# Patient Record
Sex: Female | Born: 1940 | Race: Black or African American | Hispanic: No | Marital: Married | Smoking: Never smoker
Health system: Southern US, Community
[De-identification: ages and names within clinical notes are randomized; demographics above are authoritative.]

## PROBLEM LIST (undated history)

## (undated) DIAGNOSIS — E785 Hyperlipidemia, unspecified: Secondary | ICD-10-CM

## (undated) DIAGNOSIS — Z87448 Personal history of other diseases of urinary system: Secondary | ICD-10-CM

## (undated) DIAGNOSIS — I10 Essential (primary) hypertension: Secondary | ICD-10-CM

## (undated) DIAGNOSIS — I498 Other specified cardiac arrhythmias: Secondary | ICD-10-CM

## (undated) DIAGNOSIS — L408 Other psoriasis: Secondary | ICD-10-CM

## (undated) DIAGNOSIS — Z780 Asymptomatic menopausal state: Secondary | ICD-10-CM

## (undated) DIAGNOSIS — F329 Major depressive disorder, single episode, unspecified: Secondary | ICD-10-CM

## (undated) HISTORY — DX: Other specified cardiac arrhythmias: I49.8

## (undated) HISTORY — DX: Essential (primary) hypertension: I10

## (undated) HISTORY — DX: Hyperlipidemia, unspecified: E78.5

## (undated) HISTORY — PX: EYE SURGERY: SHX253

## (undated) HISTORY — DX: Personal history of other diseases of urinary system: Z87.448

## (undated) HISTORY — DX: Other psoriasis: L40.8

## (undated) HISTORY — DX: Major depressive disorder, single episode, unspecified: F32.9

## (undated) HISTORY — DX: Asymptomatic menopausal state: Z78.0

---

## 1997-08-23 ENCOUNTER — Other Ambulatory Visit: Admission: RE | Admit: 1997-08-23 | Discharge: 1997-08-23 | Payer: Self-pay | Admitting: *Deleted

## 1998-10-28 ENCOUNTER — Other Ambulatory Visit: Admission: RE | Admit: 1998-10-28 | Discharge: 1998-10-28 | Payer: Self-pay | Admitting: *Deleted

## 1999-11-10 ENCOUNTER — Encounter: Admission: RE | Admit: 1999-11-10 | Discharge: 1999-11-10 | Payer: Self-pay | Admitting: *Deleted

## 1999-11-10 ENCOUNTER — Encounter: Payer: Self-pay | Admitting: *Deleted

## 1999-12-05 ENCOUNTER — Encounter: Payer: Self-pay | Admitting: Family Medicine

## 1999-12-05 ENCOUNTER — Encounter: Admission: RE | Admit: 1999-12-05 | Discharge: 1999-12-05 | Payer: Self-pay | Admitting: Family Medicine

## 1999-12-09 ENCOUNTER — Other Ambulatory Visit: Admission: RE | Admit: 1999-12-09 | Discharge: 1999-12-09 | Payer: Self-pay | Admitting: *Deleted

## 2000-11-10 ENCOUNTER — Encounter: Admission: RE | Admit: 2000-11-10 | Discharge: 2000-11-10 | Payer: Self-pay | Admitting: *Deleted

## 2000-11-10 ENCOUNTER — Encounter: Payer: Self-pay | Admitting: *Deleted

## 2001-03-03 ENCOUNTER — Inpatient Hospital Stay (HOSPITAL_COMMUNITY): Admission: EM | Admit: 2001-03-03 | Discharge: 2001-03-06 | Payer: Self-pay | Admitting: *Deleted

## 2001-11-11 ENCOUNTER — Encounter: Admission: RE | Admit: 2001-11-11 | Discharge: 2001-11-11 | Payer: Self-pay | Admitting: *Deleted

## 2001-11-11 ENCOUNTER — Encounter: Payer: Self-pay | Admitting: *Deleted

## 2002-04-26 ENCOUNTER — Ambulatory Visit: Admission: RE | Admit: 2002-04-26 | Discharge: 2002-04-26 | Payer: Self-pay | Admitting: Gastroenterology

## 2002-11-15 ENCOUNTER — Encounter: Payer: Self-pay | Admitting: Family Medicine

## 2002-11-15 ENCOUNTER — Encounter: Admission: RE | Admit: 2002-11-15 | Discharge: 2002-11-15 | Payer: Self-pay | Admitting: Family Medicine

## 2002-12-26 ENCOUNTER — Ambulatory Visit (HOSPITAL_COMMUNITY): Admission: RE | Admit: 2002-12-26 | Discharge: 2002-12-26 | Payer: Self-pay | Admitting: Dermatology

## 2003-01-30 ENCOUNTER — Other Ambulatory Visit (HOSPITAL_COMMUNITY): Admission: RE | Admit: 2003-01-30 | Discharge: 2003-02-06 | Payer: Self-pay | Admitting: Psychiatry

## 2003-11-16 ENCOUNTER — Encounter: Admission: RE | Admit: 2003-11-16 | Discharge: 2003-11-16 | Payer: Self-pay | Admitting: Endocrinology

## 2004-02-21 ENCOUNTER — Ambulatory Visit: Payer: Self-pay | Admitting: Endocrinology

## 2004-07-29 ENCOUNTER — Ambulatory Visit: Payer: Self-pay | Admitting: Endocrinology

## 2004-08-05 ENCOUNTER — Ambulatory Visit: Payer: Self-pay | Admitting: Endocrinology

## 2004-12-10 ENCOUNTER — Encounter: Admission: RE | Admit: 2004-12-10 | Discharge: 2004-12-10 | Payer: Self-pay | Admitting: Endocrinology

## 2005-02-11 ENCOUNTER — Ambulatory Visit: Payer: Self-pay | Admitting: Endocrinology

## 2005-08-03 ENCOUNTER — Ambulatory Visit: Payer: Self-pay | Admitting: Internal Medicine

## 2005-08-06 ENCOUNTER — Ambulatory Visit: Payer: Self-pay | Admitting: Endocrinology

## 2005-08-06 ENCOUNTER — Other Ambulatory Visit: Admission: RE | Admit: 2005-08-06 | Discharge: 2005-08-06 | Payer: Self-pay | Admitting: Endocrinology

## 2005-08-06 ENCOUNTER — Encounter: Payer: Self-pay | Admitting: Endocrinology

## 2005-09-15 ENCOUNTER — Ambulatory Visit: Payer: Self-pay | Admitting: Family Medicine

## 2005-12-11 ENCOUNTER — Encounter: Admission: RE | Admit: 2005-12-11 | Discharge: 2005-12-11 | Payer: Self-pay | Admitting: Endocrinology

## 2006-08-12 ENCOUNTER — Ambulatory Visit: Payer: Self-pay | Admitting: Endocrinology

## 2006-08-12 LAB — CONVERTED CEMR LAB
ALT: 28 units/L (ref 0–40)
AST: 31 units/L (ref 0–37)
Albumin: 4.2 g/dL (ref 3.5–5.2)
Alkaline Phosphatase: 44 units/L (ref 39–117)
Bilirubin, Direct: 0.1 mg/dL (ref 0.0–0.3)
CO2: 32 meq/L (ref 19–32)
Chloride: 104 meq/L (ref 96–112)
Cholesterol: 166 mg/dL (ref 0–200)
Eosinophils Relative: 0.6 % (ref 0.0–5.0)
GFR calc Af Amer: 93 mL/min
Glucose, Bld: 94 mg/dL (ref 70–99)
HCT: 38.7 % (ref 36.0–46.0)
HDL: 79.9 mg/dL (ref >39.0)
Hemoglobin, Urine: NEGATIVE
Hemoglobin: 13.1 g/dL (ref 12.0–15.0)
Ketones, ur: NEGATIVE mg/dL
Monocytes Absolute: 0.4 10*3/uL (ref 0.2–0.7)
Platelets: 204 10*3/uL (ref 150–400)
RBC: 4.41 M/uL (ref 3.87–5.11)
TSH: 0.87 microintl units/mL (ref 0.35–5.50)
Total Bilirubin: 0.7 mg/dL (ref 0.3–1.2)
Total CHOL/HDL Ratio: 2.1
Total Protein, Urine: NEGATIVE mg/dL
Total Protein: 7 g/dL (ref 6.0–8.3)
Urobilinogen, UA: 0.2 (ref 0.0–1.0)
pH: 7 (ref 5.0–8.0)

## 2006-08-17 ENCOUNTER — Ambulatory Visit: Payer: Self-pay | Admitting: Endocrinology

## 2006-11-30 ENCOUNTER — Encounter: Payer: Self-pay | Admitting: Endocrinology

## 2006-11-30 DIAGNOSIS — L408 Other psoriasis: Secondary | ICD-10-CM

## 2006-11-30 DIAGNOSIS — I10 Essential (primary) hypertension: Secondary | ICD-10-CM

## 2006-11-30 DIAGNOSIS — E785 Hyperlipidemia, unspecified: Secondary | ICD-10-CM

## 2006-11-30 DIAGNOSIS — Z87448 Personal history of other diseases of urinary system: Secondary | ICD-10-CM | POA: Insufficient documentation

## 2006-11-30 DIAGNOSIS — R519 Headache, unspecified: Secondary | ICD-10-CM | POA: Insufficient documentation

## 2006-11-30 DIAGNOSIS — R51 Headache: Secondary | ICD-10-CM

## 2006-11-30 DIAGNOSIS — M810 Age-related osteoporosis without current pathological fracture: Secondary | ICD-10-CM | POA: Insufficient documentation

## 2006-11-30 DIAGNOSIS — F339 Major depressive disorder, recurrent, unspecified: Secondary | ICD-10-CM

## 2006-11-30 DIAGNOSIS — F329 Major depressive disorder, single episode, unspecified: Secondary | ICD-10-CM

## 2006-11-30 DIAGNOSIS — F3289 Other specified depressive episodes: Secondary | ICD-10-CM

## 2006-11-30 HISTORY — DX: Other specified depressive episodes: F32.89

## 2006-11-30 HISTORY — DX: Personal history of other diseases of urinary system: Z87.448

## 2006-11-30 HISTORY — DX: Other psoriasis: L40.8

## 2006-11-30 HISTORY — DX: Hyperlipidemia, unspecified: E78.5

## 2006-11-30 HISTORY — DX: Major depressive disorder, single episode, unspecified: F32.9

## 2006-11-30 HISTORY — DX: Essential (primary) hypertension: I10

## 2006-12-14 ENCOUNTER — Encounter: Admission: RE | Admit: 2006-12-14 | Discharge: 2006-12-14 | Payer: Self-pay | Admitting: Endocrinology

## 2007-08-18 ENCOUNTER — Ambulatory Visit: Payer: Self-pay | Admitting: Endocrinology

## 2007-08-19 LAB — CONVERTED CEMR LAB
AST: 30 units/L (ref 0–37)
Albumin: 4.3 g/dL (ref 3.5–5.2)
Alkaline Phosphatase: 46 units/L (ref 39–117)
BUN: 9 mg/dL (ref 6–23)
CO2: 29 meq/L (ref 19–32)
Chloride: 102 meq/L (ref 96–112)
Cholesterol: 146 mg/dL (ref 0–200)
Creatinine, Ser: 0.8 mg/dL (ref 0.4–1.2)
Crystals: NEGATIVE
Eosinophils Relative: 1.3 % (ref 0.0–5.0)
GFR calc Af Amer: 93 mL/min
GFR calc non Af Amer: 77 mL/min
Glucose, Bld: 97 mg/dL (ref 70–99)
HDL: 76.3 mg/dL (ref >39.0)
MCHC: 33.6 g/dL (ref 30.0–36.0)
Mucus, UA: NEGATIVE
Neutro Abs: 4.6 10*3/uL (ref 1.4–7.7)
Nitrite: NEGATIVE
Platelets: 195 10*3/uL (ref 150–400)
RDW: 14.1 % (ref 11.5–14.6)
Sodium: 139 meq/L (ref 135–145)
Specific Gravity, Urine: <=1.005 (ref 1.000–1.03)
Triglycerides: 39 mg/dL (ref 0–149)
WBC: 6.8 10*3/uL (ref 4.5–10.5)

## 2007-08-23 ENCOUNTER — Ambulatory Visit: Payer: Self-pay | Admitting: Endocrinology

## 2007-08-23 DIAGNOSIS — Z780 Asymptomatic menopausal state: Secondary | ICD-10-CM

## 2007-08-23 DIAGNOSIS — I498 Other specified cardiac arrhythmias: Secondary | ICD-10-CM

## 2007-08-23 HISTORY — DX: Asymptomatic menopausal state: Z78.0

## 2007-08-23 HISTORY — DX: Other specified cardiac arrhythmias: I49.8

## 2007-08-31 ENCOUNTER — Ambulatory Visit: Payer: Self-pay | Admitting: Internal Medicine

## 2007-08-31 ENCOUNTER — Encounter: Payer: Self-pay | Admitting: Endocrinology

## 2007-10-05 ENCOUNTER — Encounter: Payer: Self-pay | Admitting: Endocrinology

## 2007-10-11 ENCOUNTER — Telehealth (INDEPENDENT_AMBULATORY_CARE_PROVIDER_SITE_OTHER): Payer: Self-pay | Admitting: *Deleted

## 2007-10-12 ENCOUNTER — Telehealth (INDEPENDENT_AMBULATORY_CARE_PROVIDER_SITE_OTHER): Payer: Self-pay | Admitting: *Deleted

## 2007-12-19 ENCOUNTER — Encounter: Admission: RE | Admit: 2007-12-19 | Discharge: 2007-12-19 | Payer: Self-pay | Admitting: Endocrinology

## 2007-12-27 ENCOUNTER — Encounter: Payer: Self-pay | Admitting: Endocrinology

## 2007-12-28 ENCOUNTER — Encounter: Payer: Self-pay | Admitting: Endocrinology

## 2008-02-21 ENCOUNTER — Ambulatory Visit: Payer: Self-pay | Admitting: Endocrinology

## 2008-02-27 ENCOUNTER — Telehealth (INDEPENDENT_AMBULATORY_CARE_PROVIDER_SITE_OTHER): Payer: Self-pay | Admitting: *Deleted

## 2008-02-27 ENCOUNTER — Ambulatory Visit: Payer: Self-pay | Admitting: Endocrinology

## 2008-03-06 ENCOUNTER — Telehealth: Payer: Self-pay | Admitting: Internal Medicine

## 2008-04-17 ENCOUNTER — Telehealth (INDEPENDENT_AMBULATORY_CARE_PROVIDER_SITE_OTHER): Payer: Self-pay | Admitting: *Deleted

## 2008-08-01 ENCOUNTER — Telehealth: Payer: Self-pay | Admitting: Endocrinology

## 2008-08-06 ENCOUNTER — Telehealth: Payer: Self-pay | Admitting: Endocrinology

## 2008-09-04 ENCOUNTER — Ambulatory Visit: Payer: Self-pay | Admitting: Internal Medicine

## 2008-09-04 DIAGNOSIS — N390 Urinary tract infection, site not specified: Secondary | ICD-10-CM | POA: Insufficient documentation

## 2008-09-04 LAB — CONVERTED CEMR LAB
Glucose, Urine, Semiquant: NEGATIVE
Ketones, urine, test strip: NEGATIVE
Specific Gravity, Urine: <1.005
pH: 7.5

## 2008-09-05 ENCOUNTER — Encounter: Payer: Self-pay | Admitting: Endocrinology

## 2008-09-10 ENCOUNTER — Encounter (INDEPENDENT_AMBULATORY_CARE_PROVIDER_SITE_OTHER): Payer: Self-pay | Admitting: *Deleted

## 2008-09-18 ENCOUNTER — Ambulatory Visit: Payer: Self-pay | Admitting: Endocrinology

## 2008-11-09 ENCOUNTER — Telehealth: Payer: Self-pay | Admitting: Endocrinology

## 2008-12-20 ENCOUNTER — Encounter: Admission: RE | Admit: 2008-12-20 | Discharge: 2008-12-20 | Payer: Self-pay | Admitting: Endocrinology

## 2009-01-12 ENCOUNTER — Ambulatory Visit: Payer: Self-pay | Admitting: Family Medicine

## 2009-01-12 LAB — CONVERTED CEMR LAB
Specific Gravity, Urine: <1.005
pH: 8.5

## 2009-01-14 ENCOUNTER — Telehealth: Payer: Self-pay | Admitting: Endocrinology

## 2009-08-19 ENCOUNTER — Telehealth: Payer: Self-pay | Admitting: Endocrinology

## 2009-09-09 ENCOUNTER — Telehealth: Payer: Self-pay | Admitting: Endocrinology

## 2009-10-02 ENCOUNTER — Ambulatory Visit: Payer: Self-pay | Admitting: Endocrinology

## 2009-10-02 ENCOUNTER — Encounter: Payer: Self-pay | Admitting: Internal Medicine

## 2009-10-02 DIAGNOSIS — R609 Edema, unspecified: Secondary | ICD-10-CM | POA: Insufficient documentation

## 2009-10-03 ENCOUNTER — Telehealth: Payer: Self-pay | Admitting: Endocrinology

## 2009-10-08 ENCOUNTER — Encounter: Payer: Self-pay | Admitting: Endocrinology

## 2009-10-08 ENCOUNTER — Ambulatory Visit: Payer: Self-pay | Admitting: Internal Medicine

## 2009-11-14 ENCOUNTER — Ambulatory Visit: Payer: Self-pay | Admitting: Internal Medicine

## 2009-11-14 LAB — CONVERTED CEMR LAB
Glucose, Urine, Semiquant: NEGATIVE
Nitrite: POSITIVE
Protein, U semiquant: >=300
Specific Gravity, Urine: 1.01
pH: 6.5

## 2009-12-05 ENCOUNTER — Telehealth: Payer: Self-pay | Admitting: Endocrinology

## 2009-12-25 ENCOUNTER — Encounter: Admission: RE | Admit: 2009-12-25 | Discharge: 2009-12-25 | Payer: Self-pay | Admitting: Endocrinology

## 2010-04-13 LAB — CONVERTED CEMR LAB
ALT: 31 units/L (ref 0–35)
AST: 32 units/L (ref 0–37)
Alkaline Phosphatase: 42 units/L (ref 39–117)
BUN: 11 mg/dL (ref 6–23)
Basophils Absolute: 0 10*3/uL (ref 0.0–0.1)
Basophils Relative: 0.3 % (ref 0.0–3.0)
Basophils Relative: 0.6 % (ref 0.0–3.0)
Bilirubin Urine: NEGATIVE
Bilirubin, Direct: 0.1 mg/dL (ref 0.0–0.3)
Calcium, Total (PTH): 9.9 mg/dL (ref 8.4–10.5)
Calcium: 9.6 mg/dL (ref 8.4–10.5)
Chloride: 108 meq/L (ref 96–112)
Cholesterol: 128 mg/dL (ref 0–200)
Cholesterol: 158 mg/dL (ref 0–200)
Creatinine, Ser: 0.7 mg/dL (ref 0.4–1.2)
Creatinine, Ser: 0.8 mg/dL (ref 0.4–1.2)
Eosinophils Absolute: 0.1 10*3/uL (ref 0.0–0.7)
Eosinophils Relative: 0.7 % (ref 0.0–5.0)
Eosinophils Relative: 0.8 % (ref 0.0–5.0)
GFR calc non Af Amer: 103.91 mL/min (ref >60)
GFR calc non Af Amer: 92.3 mL/min (ref >60)
Glucose, Bld: 92 mg/dL (ref 70–99)
HCT: 36.2 % (ref 36.0–46.0)
HCT: 38.9 % (ref 36.0–46.0)
HDL: 80.8 mg/dL (ref >39.00)
Hemoglobin: 12.4 g/dL (ref 12.0–15.0)
LDL Cholesterol: 53 mg/dL (ref 0–99)
LDL Cholesterol: 64 mg/dL (ref 0–99)
Lymphocytes Relative: 23.6 % (ref 12.0–46.0)
Lymphs Abs: 1.4 10*3/uL (ref 0.7–4.0)
Lymphs Abs: 1.5 10*3/uL (ref 0.7–4.0)
MCV: 87.8 fL (ref 78.0–100.0)
Monocytes Relative: 5.4 % (ref 3.0–12.0)
Monocytes Relative: 6 % (ref 3.0–12.0)
Neutro Abs: 4.9 10*3/uL (ref 1.4–7.7)
PTH: 40 pg/mL (ref 14.0–72.0)
Platelets: 170 10*3/uL (ref 150.0–400.0)
Potassium: 3.9 meq/L (ref 3.5–5.1)
RBC: 4.43 M/uL (ref 3.87–5.11)
RDW: 12.7 % (ref 11.5–14.6)
Sodium: 142 meq/L (ref 135–145)
Sodium: 143 meq/L (ref 135–145)
TSH: 0.44 microintl units/mL (ref 0.35–5.50)
TSH: 0.95 microintl units/mL (ref 0.35–5.50)
Total Bilirubin: 0.9 mg/dL (ref 0.3–1.2)
Total CHOL/HDL Ratio: 2
Total Protein: 6.9 g/dL (ref 6.0–8.3)
WBC: 6.4 10*3/uL (ref 4.5–10.5)
WBC: 6.7 10*3/uL (ref 4.5–10.5)
pH: 7.5 (ref 5.0–8.0)

## 2010-04-15 NOTE — Progress Notes (Signed)
Summary: fosamax  Phone Note Refill Request Message from:  Fax from Pharmacy on August 19, 2009 3:34 PM  Refills Requested: Medication #1:  FOSAMAX 70 MG  TABS take 1 by mouth q week  Method Requested: Fax to [facility] Initial call taken by: Orlan Leavens,  August 19, 2009 3:34 PM    Prescriptions: FOSAMAX 70 MG  TABS (ALENDRONATE SODIUM) take 1 by mouth q week  #4 x 0   Entered by:   Orlan Leavens   Authorized by:   Minus Breeding MD   Signed by:   Orlan Leavens on 08/19/2009   Method used:   Faxed to ...       [facility]* (mail-order)             ,          Ph: [phone]       Fax: [phone]   RxID:   [id number]

## 2010-04-15 NOTE — Assessment & Plan Note (Signed)
Summary: ?bladder inf,blood in urine/sae/cd   Vital Signs:  Patient profile:   70 year old female Height:      62 inches Weight:      129 pounds O2 Sat:      99 % on Room air Temp:     98.4 degrees F oral Pulse rate:   72 / minute Pulse rhythm:   regular Resp:     16 per minute BP sitting:   108 / 80  (right arm)  Vitals Entered By: Rock Nephew CMA (November 14, 2009 4:19 PM)  O2 Flow:  Room air  Primary Care Chanse Kagel:  Minus Breeding MD  CC:  Dysuria.  History of Present Illness:  Dysuria      This is a 70 year old woman who presents with Dysuria.  The symptoms began 5 days ago.  The intensity is described as moderate.  The patient reports burning with urination, urinary frequency, urgency, and hematuria, but denies vaginal discharge, vaginal itching, and vaginal sores.  The patient denies the following associated symptoms: nausea, vomiting, fever, shaking chills, flank pain, abdominal pain, back pain, pelvic pain, and arthralgias.  The patient denies the following risk factors: diabetes, prior antibiotics, immunosuppression, history of GU anomaly, history of pyelonephritis, pregnancy, history of STD, and analgesic abuse.    Preventive Screening-Counseling & Management  Alcohol-Tobacco     Alcohol drinks/day: 0     Smoking Status: never     Tobacco Counseling: to remain off tobacco products  Hep-HIV-STD-Contraception     Hepatitis Risk: no risk noted     HIV Risk: no risk noted     STD Risk: no risk noted      Sexual History:  currently monogamous.        Drug Use:  never.        Blood Transfusions:  no.    Clinical Review Panels:  Diabetes Management   Creatinine:  0.7 (10/02/2009)   Last Flu Vaccine:  Historical (12/27/2007)  CBC   WBC:  6.7 (10/02/2009)   RBC:  4.43 (10/02/2009)   Hgb:  13.2 (10/02/2009)   Hct:  38.9 (10/02/2009)   Platelets:  170.0 (10/02/2009)   MCV  87.8 (10/02/2009)   MCHC  34.0 (10/02/2009)   RDW  14.7 (10/02/2009)   PMN:   72.3 (10/02/2009)   Lymphs:  21.2 (10/02/2009)   Monos:  5.4 (10/02/2009)   Eosinophils:  0.8 (10/02/2009)   Basophil:  0.3 (10/02/2009)  Complete Metabolic Panel   Glucose:  94 (10/02/2009)   Sodium:  142 (10/02/2009)   Potassium:  3.7 (10/02/2009)   Chloride:  108 (10/02/2009)   CO2:  31 (10/02/2009)   BUN:  13 (10/02/2009)   Creatinine:  0.7 (10/02/2009)   Albumin:  4.6 (10/02/2009)   Total Protein:  7.2 (10/02/2009)   Calcium:  9.6 (10/02/2009)   Total Bili:  0.6 (10/02/2009)   Alk Phos:  38 (10/02/2009)   SGPT (ALT):  28 (10/02/2009)   SGOT (AST):  36 (10/02/2009)   Medications Prior to Update: 1)  Elidel 1 %  Crea (Pimecrolimus) .... Apply Qd 2)  Crestor 40 Mg  Tabs (Rosuvastatin Calcium) .... Take 1 By Mouth Qhs 3)  Adult Aspirin Low Strength 81 Mg  Tbdp (Aspirin) .... Take 1 By Mouth Qd 4)  Maxzide-25 37.5-25 Mg  Tabs (Triamterene-Hctz) .... Take 1/2 Tab By Mouth Qd  Current Medications (verified): 1)  Elidel 1 %  Crea (Pimecrolimus) .... Apply Qd 2)  Crestor 40 Mg  Tabs (Rosuvastatin Calcium) .... Take 1 By Mouth Qhs 3)  Adult Aspirin Low Strength 81 Mg  Tbdp (Aspirin) .... Take 1 By Mouth Qd 4)  Maxzide-25 37.5-25 Mg  Tabs (Triamterene-Hctz) .... Take 1/2 Tab By Mouth Qd 5)  Ciprofloxacin Hcl 250 Mg Tabs (Ciprofloxacin Hcl) .... 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 sister had breast cancer  Social  History: Reviewed history from 02/27/2008 and no changes required. 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  The patient denies anorexia, fever, weight loss, weight gain, chest pain, dyspnea on exertion, peripheral edema, prolonged cough, headaches, hemoptysis, abdominal pain, suspicious skin lesions, and enlarged lymph nodes.    Physical Exam  General:  alert, well-hydrated, and normal appearance.   Neck:  supple, full ROM, and no masses.   Lungs:  normal respiratory effort, no intercostal retractions, no accessory muscle use, normal breath sounds, no dullness, no fremitus, no crackles, and no wheezes.   Heart:  normal rate, regular rhythm, no murmur, no gallop, and no rub.   Abdomen:  soft, non-tender, normal bowel sounds, no distention, no masses, no guarding, no rigidity, no rebound tenderness, no abdominal hernia, no inguinal hernia, no hepatomegaly, and no splenomegaly.  No CVAT. Msk:  normal ROM, no joint tenderness, no joint swelling, no joint warmth, no redness over joints, no joint deformities, no joint instability, and no crepitation.   Pulses:  R and L carotid,radial,femoral,dorsalis pedis and posterior tibial pulses are full and equal bilaterally Extremities:  No clubbing, cyanosis, edema, or deformity noted with normal full range of motion of all joints.   Neurologic:  No cranial nerve deficits noted. Station and gait are normal. Plantar reflexes are down-going bilaterally. DTRs are symmetrical throughout. Sensory, motor and coordinative functions appear intact. Skin:  turgor normal, color normal, no rashes, no suspicious lesions, no ecchymoses, no petechiae, no purpura, no ulcerations, and no edema.   Cervical Nodes:  no anterior cervical adenopathy and  no posterior cervical adenopathy.   Axillary Nodes:  no R axillary adenopathy and no L axillary adenopathy.   Inguinal Nodes:  no R inguinal adenopathy and no L inguinal adenopathy.   Psych:  Cognition and judgment appear intact. Alert and cooperative with normal attention span and concentration. No apparent delusions, illusions, hallucinations   Impression & Recommendations:  Problem # 1:  UTI (ICD-599.0) Assessment Deteriorated  Her updated medication list for this problem includes:    Ciprofloxacin Hcl 250 Mg Tabs (Ciprofloxacin hcl) ..... One by mouth two times a day for 7 days  Orders: T-Culture, Urine ([id number])  Complete Medication List: 1)  Elidel 1 % Crea (Pimecrolimus) .... Apply qd 2)  Crestor 40 Mg Tabs (Rosuvastatin calcium) .... Take 1 by mouth qhs 3)  Adult Aspirin Low Strength 81 Mg Tbdp (Aspirin) .Marland KitchenMarland KitchenMarland Kitchen  Take 1 by mouth qd 4)  Maxzide-25 37.5-25 Mg Tabs (Triamterene-hctz) .... Take 1/2 tab by mouth qd 5)  Ciprofloxacin Hcl 250 Mg Tabs (Ciprofloxacin hcl) .... One by mouth two times a day for 7 days  Patient Instructions: 1)  Please schedule a follow-up appointment in 1 month. 2)  Take your antibiotic as prescribed until ALL of it is gone, but stop if you develop a rash or swelling and contact our office as soon as possible. Prescriptions: CIPROFLOXACIN HCL 250 MG TABS (CIPROFLOXACIN HCL) One by mouth two times a day for 7 days  #14 x 1   Entered and Authorized by:   Etta Grandchild MD   Signed by:   Etta Grandchild MD on 11/14/2009   Method used:   [facility]        [facility] [phone]* (retail)       [address]       [place], [place]  [id number]       Ph: [phone] or [phone]       Fax: [phone]   RxID:   [id number] CIPROFLOXACIN HCL 250 MG TABS (CIPROFLOXACIN HCL) One by mouth two times a day for 7 days  #14 x 1   Entered and Authorized by:   Etta Grandchild MD   Signed by:   Etta Grandchild MD on  11/14/2009   Method used:   Electronically to        [facility] (retail)       [address]       [place], [place]  [address]       Ph: [phone]       Fax: [phone]   RxID:   [id number]   Laboratory Results   Urine Tests   Date/Time Reported: Lamar Sprinkles, CMA  November 14, 2009 4:26 PM   Routine Urinalysis   Color: red Appearance: Cloudy Glucose: negative   (Normal Range: Negative) Bilirubin: negative   (Normal Range: Negative) Ketone: negative   (Normal Range: Negative) Spec. Gravity: 1.010   (Normal Range: 1.003-1.035) Blood: large   (Normal Range: Negative) pH: 6.5   (Normal Range: 5.0-8.0) Protein: >=300   (Normal Range: Negative) Urobilinogen: negative   (Normal Range: 0-1) Nitrite: positive   (Normal Range: Negative) Leukocyte Esterace: large   (Normal Range: Negative)

## 2010-04-15 NOTE — Assessment & Plan Note (Signed)
Summary: yearly f/u medicare/#/cd   Vital Signs:  Patient profile:   70 year old female Height:      62 inches (157.48 cm) Weight:      128.50 pounds (58.41 kg) BMI:     23.59 O2 Sat:      94 % on Room air Temp:     98.1 degrees F (36.72 degrees C) oral Pulse rate:   66 / minute BP sitting:   128 / 74  (left arm) Cuff size:   regular  Vitals Entered By: Brenton Grills MA (October 02, 2009 8:25 AM)  O2 Flow:  Room air CC: Yearly F/U appt/aj   Primary Provider:  Minus Breeding MD  CC:  Yearly F/U appt/aj.  History of Present Illness: here for regular wellness examination.  she's feeling pretty well in general, and does not smoke.  alcohol is rare. she wants labs for estrogen and edema  Current Medications (verified): 1)  Elidel 1 %  Crea (Pimecrolimus) .... Apply Qd 2)  Crestor 40 Mg  Tabs (Rosuvastatin Calcium) .... Take 1 By Mouth Qhs 3)  Adult Aspirin Low Strength 81 Mg  Tbdp (Aspirin) .... Take 1 By Mouth Qd 4)  Fosamax 70 Mg  Tabs (Alendronate Sodium) .... Take 1 By Mouth Q Week 5)  Maxzide-25 37.5-25 Mg  Tabs (Triamterene-Hctz) .... Take 1/2 Tab By Mouth Qd  Allergies (verified): No Known Drug Allergies  Past History:  Past Medical History: Last updated: 11/30/2006 Depression Hyperlipidemia Hypertension Osteoporosis  Family History: Reviewed history from 08/23/2007 and no changes required. sister had breast cancer  Social History: Reviewed history from 02/27/2008 and no changes required. 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    Physical Exam  General:  normal appearance.   Head:  head: no deformity eyes: no periorbital swelling, no proptosis external  nose and ears are normal mouth: no lesion seen Neck:  Supple without thyroid enlargement or tenderness.  Breasts:  No tenderness, masses, nipple discharge, or skin abnormalities.  Lungs:  Clear to auscultation bilaterally. Normal respiratory effort.  Heart:  Regular rate and rhythm without murmurs or gallops noted. Normal S1,S2.   Abdomen:  abdomen is soft, nontender.  no hepatosplenomegaly.   not distended.  no hernia  Rectal:  normal external and internal exam.  heme neg  Genitalia:  Pelvic Exam:        External: normal female genitalia without lesions or masses        Vagina: normal without lesions or masses        Cervix: normal without lesions or masses        Adnexa: normal bimanual exam without masses or fullness        Uterus: normal by palpation Msk:  muscle bulk and strength are grossly normal.  no obvious joint swelling.  gait is normal and steady  Pulses:  dorsalis pedis intact bilat.  no carotid bruit  Extremities:  no deformity.  no ulcer on the feet.  feet are of normal color and temp.  no edema  Neurologic:  cn 2-12 grossly intact.   readily moves all 4's.   sensation is intact to touch on the feet  Skin:  normal texture and temp.  no rash.  not diaphoretic  Cervical Nodes:  No  significant adenopathy.  Psych:  Alert and cooperative; normal mood and affect; normal attention span and concentration.     Impression & Recommendations:  Problem # 1:  ROUTINE GENERAL MEDICAL EXAM@HEALTH  CARE FACL (ICD-V70.0)  Problem # 2:  OSTEOPOROSIS (ICD-733.00) she has completed course of fosamax  Other Orders: T-Parathyroid Hormone, Intact w/ Calcium (08657-84696) T-Estradiol (29528-41324) EKG w/ Interpretation (93000) T-Bone Densitometry (40102) TLB-Lipid Panel (80061-LIPID) TLB-BMP (Basic Metabolic Panel-BMET) (80048-METABOL) TLB-CBC Platelet - w/Differential (85025-CBCD) TLB-Hepatic/Liver Function Pnl (80076-HEPATIC) TLB-TSH (Thyroid Stimulating Hormone)  (84443-TSH) TLB-BNP (B-Natriuretic Peptide) (83880-BNPR) Est. Patient 65& > (72536)  Patient Instructions: 1)  stop fosamax 2)  please consider these measures for your health:  minimize alcohol.  do not use tobacco products.  have a colonoscopy at least every 10 years from age 15.  keep firearms safely stored.  always use seat belts.  have working smoke alarms in your home.  see an eye doctor and dentist regularly.  never drive under the influence of alcohol or drugs (including prescription drugs).   3)  please let me know what your wishes would be, if artificial life support measures should become necessary.  it is critically important to prevent falling down (keep floor areas well-lit, dry, and free of loose objects) 4)  blood tests are being ordered for you today.  please call [phone] to hear your test results. 5)  recheck bone density test Prescriptions: MAXZIDE-25 37.5-25 MG  TABS (TRIAMTERENE-HCTZ) take 1/2 tab by mouth qd  #45 Tablet x 3   Entered and Authorized by:   Minus Breeding MD   Signed by:   Minus Breeding MD on 10/02/2009   Method used:   [facility]        [facility]  [address] [phone][facility] (retail)       [address]       [place], [place]  [address]       Ph: [phone] or [phone]       Fax: [phone]   RxID:   [id number] CRESTOR 40 MG  TABS (ROSUVASTATIN CALCIUM) take 1 by mouth qhs  #90 x 3   Entered and Authorized by:   Minus Breeding MD   Signed by:   Minus Breeding MD on 10/02/2009   Method used:   [facility]        [facility]  [address] [phone]* (retail)       [address]       [place], [place]  [address]       Ph: [phone] or [phone]       Fax: [phone]   RxID:   [id number] ELIDEL 1 %  CREA (PIMECROLIMUS) apply qd  #1 med tube x 2   Entered and Authorized by:   Minus Breeding MD   Signed by:   Minus Breeding MD on 10/02/2009   Method used:   [facility]         [facility]  [address] [phone]* (retail)       [address]       [place], [place]  [address]       Ph: [phone] or [phone]       Fax: [phone]   RxID:   [id number]

## 2010-04-15 NOTE — Progress Notes (Signed)
Summary: Rx req  Phone Note Refill Request Message from:  Patient on October 03, 2009 1:23 PM  Refills Requested: Medication #1:  ELIDEL 1 %  CREA apply qd   Dosage confirmed as above?Dosage Confirmed   Supply Requested: 3 months Pt request Rx to [facility]   Method Requested: Electronic Initial call taken by: Margaret Pyle, CMA,  October 03, 2009 1:23 PM    Prescriptions: ELIDEL 1 %  CREA (PIMECROLIMUS) apply qd  #3 med tube x 0   Entered by:   Margaret Pyle, CMA   Authorized by:   Minus Breeding MD   Signed by:   Margaret Pyle, CMA on 10/03/2009   Method used:   Electronically to        [facility] (retail)             ,          Ph: [phone]       Fax: [phone]   RxID:   [id number]

## 2010-04-15 NOTE — Miscellaneous (Signed)
Summary: BONE DENSITY  Clinical Lists Changes  Orders: Added new Test order of T-Lumbar Vertebral Assessment (77082) - Signed 

## 2010-04-15 NOTE — Progress Notes (Signed)
Summary: Rx refill req  Phone Note Refill Request Message from:  Patient on September 09, 2009 10:34 AM  Refills Requested: Medication #1:  MAXZIDE-25 37.5-25 MG  TABS take 1/2 tab by mouth qd.   Dosage confirmed as above?Dosage Confirmed   Supply Requested: 3 months  Method Requested: Fax to [facility] Next Appointment Scheduled: 07.15.2011 Initial call taken by: Margaret Pyle, CMA,  September 09, 2009 10:34 AM    Prescriptions: MAXZIDE-25 37.5-25 MG  TABS (TRIAMTERENE-HCTZ) take 1/2 tab by mouth qd  #45 Tablet x 3   Entered by:   Margaret Pyle, CMA   Authorized by:   Minus Breeding MD   Signed by:   Margaret Pyle, CMA on 09/09/2009   Method used:   Electronically to        [facility]* (retail)             ,          Ph: [phone]       Fax: [phone]   RxID:   [id number]

## 2010-04-15 NOTE — Progress Notes (Signed)
Summary: test results  Phone Note Call from Patient Call back at [phone]   Reason for Call: Lab or Test Results Summary of Call: Pt called requesting test results Initial call taken by: Brenton Grills MA,  December 05, 2009 5:05 PM  Follow-up for Phone Call        left message on machine for pt to return my call. Margaret Pyle, CMA  December 06, 2009 8:31 AM   Pt advised that Bone Density Test normal Follow-up by: Margaret Pyle, CMA,  December 06, 2009 9:37 AM

## 2010-05-19 ENCOUNTER — Telehealth: Payer: Self-pay | Admitting: Endocrinology

## 2010-05-20 ENCOUNTER — Telehealth: Payer: Self-pay | Admitting: Endocrinology

## 2010-05-27 NOTE — Progress Notes (Signed)
Summary: ALT med  Phone Note From Pharmacy   Caller: [facility] [phone] ref ID [id number] Summary of Call: [facility] called stating Triamterene 37.5-25 is on maufacture back order. Pharmacy is suggesting capsules 1 cap every other day or 75-25 1/4 tab daily. Please advise Initial call taken by: Margaret Pyle, CMA,  May 19, 2010 4:21 PM  Follow-up for Phone Call        i sent new rx.  please advise pt of the change, and the reason for it. Follow-up by: Minus Breeding MD,  May 19, 2010 5:14 PM    New/Updated Medications: TRIAMTERENE-HCTZ 75-50 MG TABS (TRIAMTERENE-HCTZ) 1/2 tab three times weekly (m, w, f) Prescriptions: TRIAMTERENE-HCTZ 75-50 MG TABS (TRIAMTERENE-HCTZ) 1/2 tab three times weekly (m, w, f)  #20 x 3   Entered and Authorized by:   Minus Breeding MD   Signed by:   Minus Breeding MD on 05/19/2010   Method used:   Electronically to        [facility] (retail)             ,          Ph: [phone]       Fax: [phone]   RxID:   [id number]

## 2010-05-27 NOTE — Progress Notes (Signed)
Summary: New Rx request  Phone Note From Pharmacy   Caller: [facility] [phone] Call For: New Rx  Summary of Call: [facility] called concerning Pt Rx for Triamterene-HCTZ 37.5-25mg  (75-50mg  1/2 tab once daily).Caller states that this medication is ob Back Order from the manufacturer and is requesting a new Rx before Thursday, 05/22/2010 @ 2:00pm to be able to refill Pt's request. Callback [phone] M-F 9:00am-5:30pm Leave Msge w/directions. Please use Reference #[id number] Initial call taken by: Burnard Leigh [facility]),  May 20, 2010 12:55 PM  Follow-up for Phone Call        i think this was done yesterday. Follow-up by: Minus Breeding MD,  May 20, 2010 1:00 PM  Additional Follow-up for Phone Call Additional follow up Details #1::        addressed 05/19/2010 Additional Follow-up by: Margaret Pyle, CMA,  May 20, 2010 1:15 PM

## 2010-05-29 ENCOUNTER — Telehealth: Payer: Self-pay | Admitting: Endocrinology

## 2010-06-03 NOTE — Progress Notes (Signed)
Summary: Alt RX  Phone Note From Pharmacy   Caller: [facility] Summary of Call: Per [facility], Triamterene/HCTZ 75/50 tabs are unavailable with all generic manufacturers. They are requesting an alternative drug of Trimaterene/HCTZ 37.5/25 caps (generic Dyazide) Initial call taken by: Brenton Grills CMA [facility]ull),  May 29, 2010 11:55 AM  Follow-up for Phone Call        rx sent Follow-up by: Minus Breeding MD,  May 29, 2010 12:05 PM    New/Updated Medications: TRIAMTERENE-HCTZ 37.5-25 MG TABS (TRIAMTERENE-HCTZ) 1/2 tab once daily Prescriptions: TRIAMTERENE-HCTZ 37.5-25 MG TABS (TRIAMTERENE-HCTZ) 1/2 tab once daily  #90 x 1   Entered by:   Brenton Grills CMA (AAMA)   Authorized by:   Minus Breeding MD   Signed by:   Brenton Grills CMA (AAMA) on 05/29/2010   Method used:   Electronically to        [facility] (retail)             ,          Ph: [phone]       Fax: [phone]   RxID:   [id number] TRIAMTERENE-HCTZ 37.5-25 MG TABS (TRIAMTERENE-HCTZ) 1/2 tab once daily  #30 x 5   Entered and Authorized by:   Minus Breeding MD   Signed by:   Minus Breeding MD on 05/29/2010   Method used:   Electronically to        [facility] (retail)       [address]       [place], [place]  [address]       Ph: [phone]       Fax: [phone]   RxID:   [id number]

## 2010-08-01 NOTE — Discharge Summary (Signed)
[facility]  Patient:    Tanya Espinoza, Tanya Espinoza Visit Number: [id number] MRN: [id number]          Service Type: PSY Location: 400 [id number] Attending Physician:  Jeanice Lim Dictated by:   Jasmine Pang, M.D. Admit Date:  03/03/2001 Discharge Date: 03/06/2001                             Discharge Summary  REASON FOR ADMISSION:  The patient was a 70 year old African-American female who was married.  She was admitted on a voluntary basis via the emergency room.  The patient was found on the floor in the bathroom by her husband.  She was agitated and combative in the emergency room with fluttering eyes and altered consciousness.  The patients husband reports that she had one prior episode about two and a half years ago after she retired.  She had worked at [employer] for 30 years and stated "I wasnt ready to retire."  She lost her aging father who had multiple medical problems in 05-27-00.  She endorsed depressive symptoms including crying episodes, insomnia, increased irritability, and anxiety.  She denies any auditory or visual hallucinations.  PAST PSYCHIATRIC HISTORY:  The patients primary care Tanya Espinoza has been treating her for some depression for the past two months.  She has had no psychiatric treatment.  She did obtain counseling from a psychotherapist for three visits after her fathers death in 05-28-2022.  She has no history of prior admissions, no history of prior suicide attempts.  PAST MEDICAL HISTORY:  The patient is followed by Dr. Mosetta Putt.  She has borderline hypertension.  She denies any other acute medical problems.  CURRENT MEDICATIONS: 1. Triamterene and hydrochlorothiazide 37.5/25 mg. 3. Celexa 20 mg q.a.m. by her primary care physician.  DRUG ALLERGIES:  None.  PHYSICAL EXAMINATION:  GENERAL:  Done in the emergency room; see this report.  No acute medical problems were noted to be accounting for her unusual  behaviors.  ADMISSION LABORATORY DATA:  PT, INR, and PTT were within normal limits. Routine chemistry profile was grossly within normal limits except for a slightly decreased potassium of 3.3 (3.5-5.5).  Glucose was slightly elevated at 123 (70-115).  Thyroid panel was within normal limits except for a slightly decreased T4 3.3 (4.5-10.9), TSH was not in the chart at the time of this dictation.  Acetaminophen level was less than 10.  HOSPITAL COURSE:  Upon admission, the patient was placed on Ativan 2 mg now. She was also placed on 2 mg q.6h. p.r.n. anxiety and agitation.  She was placed on Risperdal 0.5 mg now and 1 mg q.h.s.  The following day she was begun on Risperdal 0.25 mg b.i.d., Risperdal 0.5 mg q.6h. p.r.n. psychosis, and Ambien 10 mg q.h.s. p.r.n. insomnia.  She was also placed on Triamterene, hydrochlorothiazide 37.5/25 mg q.d.  The patient responded well to these interventions.  Her psychosis resolved and her mental status had improved. She was less depressed, more interactive and verbal.  There was no suicidal or homicidal ideation, no psychosis or perceptual disturbance.  She was able to be transitioned to outpatient care.  Her husband wanted her to be home and stated he planned to stay with her constantly at the time of discharge.  DISCHARGE DIAGNOSES: Axis I:    Major depression, single episode, severe with psychotic features. Axis II:   None. Axis III:  1. Hypertension.  2. Elevated liver enzymes, not otherwise specified. Axis IV:   Moderate. Axis V:    Global assessment of functioning upon admission was 32, present            global assessment of functioning 45, highest past year 78.  DISCHARGE MEDICATIONS: 1. Risperdal 0.5 mg one half pill daily and two pills at bedtime. 2. Ambien 10 mg at bedtime. 3. Lexapro 10 mg at bedtime. 4. Maxzide 37.5 mg/25 mg q.d. for hypertension  ACTIVITY LEVEL:  No restrictions.  DIET:  As per primary care physician  recommendation.  POST HOSPITAL CARE PLAN:  Arranged with Dr. Elna Breslow. Dictated by:   Jasmine Pang, M.D. Attending Physician:  Jeanice Lim DD:  04/15/01 TD:  04/15/01 Job: [id number]

## 2010-08-01 NOTE — H&P (Signed)
[facility]  Patient:    Tanya Espinoza, Tanya Espinoza Visit Number: [id number] MRN: [id number]          Service Type: Attending:  Jeanice Lim, M.D. Dictated by:   Young Berry Scott, N.P. Adm. Date:  03/03/01                     Psychiatric Admission Assessment  DATE OF ADMISSION:  March 03, 2001.  IDENTIFYING INFORMATION:  This is a 70 year old African-American female who is married.  She is a voluntary admission via the emergency room.  HISTORY OF THE PRESENT ILLNESS:  The patients husband found her on the floor of the bathroom and patient was agitated and combative in the emergency room, with fluttering eyes and altered consciousness.  She had some bizarre facial movements, and was slow to answer questions and was quite uncommunicative in emergency room where she was treated with Ativan.  The patients husband at that time reported that the patient had 1 prior episode about 2-1/2 years ago after she retired, and has had previous similar episodes, but this was the worst.  The patient today is alert and reports a 2 year history of increased stress, which began with taking early retirement from her job at [employer] for 30 years.  She states "I wasnt ready to retire."  But her job had changed and she felt stressed by the changes so went ahead and took early retirement and then regretted it somewhat.  The patient has also over these past 2-1/2 years been caring for an aging father who had multiple medical problems and who subsequently died in 05-30-00.  Following his death, the patient admits that she developed increase in crying spells, and that her husband would come home from work and find her crying and encouraged at that time to get some help.  She also endorses insomnia, increased tension and irritability, with constant worries and unable to shut her mind off at night.  More recently over the past 6 months, she and her husband chose  to redecorate the house, she thought would be a 1 month project and turned into a 6 month project, and has added stress on her, and now with the arrival of the holidays she has felt unable to cope.  She states that the episode yesterday that occurred in the bathroom occurred after she had an argument with her husband about learning some money to a nephew who has a drug habit.  The patient today denies any specific suicidal ideation, denies any auditory or visual hallucinations, and is able to promise safety on the unit.  She denies any homicidal ideation.  PAST PSYCHIATRIC HISTORY:  The patients primary care Momoka Stringfield has been treating her for some depression for the past 2 months and thats been the extent of her treatment.  She has no psychiatric history.  She did obtain some counseling from a psychotherapist for 3 visits after her fathers death in 2022-05-31.  She has no history of prior admissions, no history of prior suicide attempts.  SOCIAL HISTORY:  The patient has been married for the past 31 years.  She has 2 sons who are grown and live on their own.  The patient lives at home currently with her husband, who continues to work full time.  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  The patient has been recently redecorating her house, which the length of the project and  the disorganization of it has acted as a stressor for her.  She feels generally successful in her life and has supportive family members.  FAMILY HISTORY:  Positive for 2 brothers with history of bipolar disorder and 1 brother with a history of substance abuse.  ALCOHOL AND DRUG HISTORY:  The patient does use occasional alcohol socially and denies any other substance abuse.  PAST MEDICAL HISTORY:  The patient is followed by Dr. Mosetta Putt who she last saw approximately 1 week ago to follow up on her depression issue and to check her blood pressure.   Medical problems are borderline hypertension. Patient denies any other acute medical problems and has regular physicals with her primary care Legna Mausolf.  Past medical history:  The patient denies any history of hepatitis or substance abuse, except for taking occasional diet supplements.  CURRENT MEDICATIONS:  Triamterene/hydrochlorothiazide 37.5/25, Celexa which she has taken for about 2 months, dose unknown.  Patient also has taken some fluoxetine in the past which she states was not helpful to her.  She takes a diet pill over the past 2-3 weeks, called Diet Fuel, which is a pill from [facility].  DRUG ALLERGIES:  None.  POSITIVE PHYSICAL FINDINGS:  The patients physical examination was done in the emergency room which it was essentially normal.  Vital signs today are temp 98.3, pulse 76, respirations 16, blood pressure 114/68, and patient has not been weighed yet at this point.  Her Tylenol level was 17.1 on admission to the emergency room, and today her acetaminophen level was within normal limits.  Her salicylate level was less than 4.  Her SGOT was 46, SGPT was 45, urine drug screen was negative, urinalysis was negative, CBC is within normal limits, and patients thyroid panel was within normal limits.  PT, PTT, and INR were all within normal limits also.  MENTAL STATUS EXAMINATION:  This is a fully alert African-American female who is of normal build, healthy in appearance, no acute distress.  She is initially withdrawn and aloof, with her head down and the covers up around her ears, however after a short amount of conversation she warms quite quickly, becomes fully cooperative and emotionally available, relaxed and smiling during the conversation.  Speech is normal in pace and tone, no pressure noted.  Mood is depressed, guarded initially, mildly anxious at times. Thought process is logical and goal directed.  She gives a well organized history of herself.  No  evidence of suicidal ideation today, no homicidal ideation, no evidence of psychosis at this time, and she has had some  Risperdal last night and reports that she slept well.  Cognitively, she is intact and oriented x 4.  ADMISSION DIAGNOSES: Axis I:    Major depression, single episode, with psychotic features. Axis II:   Deferred. Axis III:  Hypertension, elevated liver enzymes not otherwise specified. Axis IV:   Moderate problems with the primary support group, specifically            conflict within her family, and some disruption of her            environment with her recent home projects. Axis V:    Current 32, past year 64.  INITIAL PLAN OF CARE:  Voluntarily admit the patient to stabilize her mood and to ensure safety and improve her reality testing.  Goal is to establish a safety plan, make sure her reality testing is accurate and she has no suicidal ideation.  Initially, she was medicated with  Ativan 1 mg p.o. and we have on 1 mg q.6h. p.r.n. for agitation.  We did chose to initiate some Risperdal 1 mg p.o. q.h.s. and 0.5 mg q.6h. p.r.n. for psychosis and agitation, and we will also routinely give her Risperdal 0.25 mg b.i.d.  She also has Ambien 10 mg available to sleep.  We will restart her Maxide and monitor her blood pressure and refer her back to her primary care physician for any followup of her liver enzymes.  This may just be due to some chronic intake of Tylenol, although at this time she denies any abuse of medications.  We will continue to observe and have started her on individual and group intensive psychotherapy.  We will also have her husband in for a family session to gage the level of support and a plan for followup.  ESTIMATED LENGTH OF STAY:  3 to 4 days. Dictated by:   Young Berry Scott, N.P. Attending:  Jeanice Lim, M.D. DD:  03/04/01 TD:  03/06/01 Job: [id number]

## 2010-08-01 NOTE — Op Note (Signed)
   NAME:  Tanya Espinoza, Tanya Espinoza                       ACCOUNT NO.:  [id number]   MEDICAL RECORD NO.:  [id number]                   PATIENT TYPE:  AMB   LOCATION:  [facility]                                 FACILITY:  [facility]   PHYSICIAN:  Anselmo Rod, M.D.               DATE OF BIRTH:  04-Apr-1940   DATE OF PROCEDURE:  04/26/2002  DATE OF DISCHARGE:                                 OPERATIVE REPORT   PROCEDURE PERFORMED:  Screening colonoscopy.   ENDOSCOPIST:  Charna Elizabeth, M.D.   INSTRUMENT USED:  Olympus video colonoscope.   INDICATIONS FOR PROCEDURE:  The patient is a 70 year old African-American  female undergoing screening colonoscopy to rule out colonic polyps, masses,  etc.   PREPROCEDURE PREPARATION:  Informed consent was procured from the patient.  The patient was fasted for eight hours prior to the procedure and prepped  with a bottle of MiraLax and Gatorade the night prior to the procedure.   PREPROCEDURE PHYSICAL:  The patient had stable vital signs.  Neck supple.  Chest clear to auscultation.  S1 and S2 regular.  Abdomen soft with normal  bowel sounds.   DESCRIPTION OF PROCEDURE:  The patient was placed in left lateral decubitus  position and sedated with 75 mg of Demerol and 7.5 mg of Versed  intravenously.  Once the patient was adequately sedated and maintained on  low flow oxygen and continuous cardiac monitoring, the Olympus video  colonoscope was advanced from the rectum to the cecum and terminal ileum  without difficulty.  The entire colonic mucosa appeared healthy except for  left-sided diverticulosis.  No masses, polyps, erosions, ulcerations, etc  were seen.  The appendicular orifice and ileocecal valve were clearly  visualized and photographed.   IMPRESSION:  1. Normal colonoscopy except for left-sided diverticulosis.  2. No masses or polyps.   RECOMMENDATIONS:  1. A high fiber diet has been discussed with the patient and brochures have     been given to her  for education.  15 to 20 g of fiber in the diet have     been advocated along with liberal fluid intake.  2.     Repeat colorectal cancer screening was recommended in the next five years     unless the patient develops any abnormal symptoms in the interim.  3. Outpatient follow-up on a p.r.n. basis.                                                    Anselmo Rod, M.D.    JNM/MEDQ  D:  04/26/2002  T:  04/26/2002  Job:  [id number]   cc:   Mosetta Putt, M.D.  [address] [place]  [place] [address]  Fax: [phone]

## 2010-10-03 ENCOUNTER — Encounter: Payer: Self-pay | Admitting: Endocrinology

## 2010-10-03 ENCOUNTER — Ambulatory Visit (INDEPENDENT_AMBULATORY_CARE_PROVIDER_SITE_OTHER): Payer: Medicare Other | Admitting: Endocrinology

## 2010-10-03 ENCOUNTER — Other Ambulatory Visit (INDEPENDENT_AMBULATORY_CARE_PROVIDER_SITE_OTHER): Payer: Medicare Other

## 2010-10-03 VITALS — BP 108/78 | HR 60 | Temp 98.1°F | Ht 61.0 in | Wt 138.8 lb

## 2010-10-03 DIAGNOSIS — Z79899 Other long term (current) drug therapy: Secondary | ICD-10-CM | POA: Insufficient documentation

## 2010-10-03 DIAGNOSIS — F329 Major depressive disorder, single episode, unspecified: Secondary | ICD-10-CM

## 2010-10-03 DIAGNOSIS — I10 Essential (primary) hypertension: Secondary | ICD-10-CM

## 2010-10-03 DIAGNOSIS — F3289 Other specified depressive episodes: Secondary | ICD-10-CM

## 2010-10-03 DIAGNOSIS — R609 Edema, unspecified: Secondary | ICD-10-CM

## 2010-10-03 DIAGNOSIS — E785 Hyperlipidemia, unspecified: Secondary | ICD-10-CM

## 2010-10-03 DIAGNOSIS — Z87448 Personal history of other diseases of urinary system: Secondary | ICD-10-CM

## 2010-10-03 DIAGNOSIS — Z0000 Encounter for general adult medical examination without abnormal findings: Secondary | ICD-10-CM

## 2010-10-03 DIAGNOSIS — Z23 Encounter for immunization: Secondary | ICD-10-CM

## 2010-10-03 DIAGNOSIS — H6120 Impacted cerumen, unspecified ear: Secondary | ICD-10-CM

## 2010-10-03 LAB — URINALYSIS, ROUTINE W REFLEX MICROSCOPIC
Leukocytes, UA: NEGATIVE
Nitrite: NEGATIVE
Urobilinogen, UA: 0.2 (ref 0.0–1.0)

## 2010-10-03 LAB — LIPID PANEL
Cholesterol: 149 mg/dL (ref 0–200)
HDL: 90 mg/dL (ref >39.00)
Total CHOL/HDL Ratio: 2

## 2010-10-03 LAB — CBC WITH DIFFERENTIAL/PLATELET
Basophils Absolute: 0 10*3/uL (ref 0.0–0.1)
Basophils Relative: 0.1 % (ref 0.0–3.0)
Eosinophils Absolute: 0 10*3/uL (ref 0.0–0.7)
Lymphocytes Relative: 19.5 % (ref 12.0–46.0)
Lymphs Abs: 1.6 10*3/uL (ref 0.7–4.0)
MCHC: 33.5 g/dL (ref 30.0–36.0)
Monocytes Absolute: 0.4 10*3/uL (ref 0.1–1.0)
Neutrophils Relative %: 75.5 % (ref 43.0–77.0)
RBC: 4.36 Mil/uL (ref 3.87–5.11)
WBC: 8.3 10*3/uL (ref 4.5–10.5)

## 2010-10-03 LAB — BASIC METABOLIC PANEL
Calcium: 9.5 mg/dL (ref 8.4–10.5)
Creatinine, Ser: 0.7 mg/dL (ref 0.4–1.2)

## 2010-10-03 LAB — HEPATIC FUNCTION PANEL: Albumin: 4.7 g/dL (ref 3.5–5.2)

## 2010-10-03 LAB — TSH: TSH: 0.92 u[IU]/mL (ref 0.35–5.50)

## 2010-10-03 NOTE — Patient Instructions (Signed)
blood tests are being ordered for you today.  please call [phone] to hear your test results.  You will be prompted to enter the 9-digit "MRN" number that appears at the top left of this page, followed by #.  Then you will hear the message. please consider these measures for your health:  minimize alcohol.  do not use tobacco products.  have a colonoscopy at least every 10 years from age 68.  Women should have an annual mammogram from age 13.  keep firearms safely stored.  always use seat belts.  have working smoke alarms in your home.  see an eye doctor and dentist regularly.  never drive under the influence of alcohol or drugs (including prescription drugs).   please let me know what your wishes would be, if artificial life support measures should become necessary.  it is critically important to prevent falling down (keep floor areas well-lit, dry, and free of loose objects)

## 2010-10-03 NOTE — Progress Notes (Signed)
Subjective:    Patient ID: Tanya Espinoza, female    DOB: 1940/06/08, 70 y.o.   MRN: [id number]  HPI Subjective:   Patient here for Medicare annual wellness visit and management of other chronic and acute problems.     Risk factors: advanced age    Roster of Physicians Providing Medical Care to Patient: Opthal: groat  Activities of Daily Living: In your present state of health, do you have any difficulty performing the following activities?:  Preparing food and eating?: No  Bathing yourself: No  Getting dressed: No  Using the toilet:No  Moving around from place to place: No  In the past year have you fallen or had a near fall?: No    Home Safety: Has smoke detector and wears seat belts. No firearms. No excess sun exposure.  Diet and Exercise  Current exercise habits: pt says good Dietary issues discussed: pt reports a healthy diet   Depression Screen  Q1: Over the past two weeks, have you felt down, depressed or hopeless? no  Q2: Over the past two weeks, have you felt little interest or pleasure in doing things? no   The following portions of the patient's history were reviewed and updated as appropriate: allergies, current medications, past family history, past medical history, past social history, past surgical history and problem list.  Past Medical History  Diagnosis Date  . HYPERLIPIDEMIA 11/30/2006  . DEPRESSION 11/30/2006  . HYPERTENSION 11/30/2006  . BRADYCARDIA 08/23/2007  . PSORIASIS 11/30/2006  . OSTEOPOROSIS 11/30/2006  . ASYMPTOMATIC POSTMENOPAUSAL STATUS 08/23/2007  . UTI'S, HX OF 11/30/2006    No past surgical history on file.  History   Social History  . Marital Status: Married    Spouse Name: N/A    Number of Children: N/A  . Years of Education: N/A   Occupational History  . Insurance    Social History Main Topics  . Smoking status: Never Smoker   . Smokeless tobacco: Not on file  . Alcohol Use: No  . Drug Use: No  . Sexually Active: Not on file    Other Topics Concern  . Not on file   Social History Narrative  . No narrative on file    Current Outpatient Prescriptions on File Prior to Visit  Medication Sig Dispense Refill  . aspirin 81 MG tablet Take 81 mg by mouth daily.        . pimecrolimus (ELIDEL) 1 % cream Apply topically daily.        . rosuvastatin (CRESTOR) 40 MG tablet Take 40 mg by mouth at bedtime.        . triamterene-hydrochlorothiazide (MAXZIDE-25) 37.5-25 MG per tablet 1/2 tablet by mouth once daily         No Known Allergies  Family History  Problem Relation Age of Onset  . Cancer Sister     Breast Cancer    BP 108/78  Pulse 60  Temp(Src) 98.1 F (36.7 C) (Oral)  Ht 5\' 1"  (1.549 m)  Wt 138 lb 12.8 oz (62.959 kg)  BMI 26.23 kg/m2  SpO2 96%   Review of Systems  Denies hearing loss, and visual loss Objective:   Vision:  Sees opthalmologist Hearing: grossly normal Body mass index:  See vs page Msk: pt easily and quickly performs "get-up-and-go" from a sitting position Cognitive Impairment Assessment: cognition, memory and judgment appear normal.  remembers 3/3 at 5 minutes.  excellent recall.  can easily read and write a sentence.  alert and oriented x 3  Assessment:   Medicare wellness utd on preventive parameters    Plan:   During the course of the visit the patient was educated and counseled about appropriate screening and preventive services including:        Fall prevention   Screening mammography  Bone densitometry screening  Diabetes screening  Nutrition counseling   Vaccines / LABS Zostavax / Pnemonccoal Vaccine  today  PSA   Patient Instructions (the written plan) was given to the patient.        Review of Systems     Objective:   Physical Exam        Assessment & Plan:     SEPARATE EVALUATION FOLLOWS--EACH PROBLEM HERE IS NEW, NOT RESPONDING TO TREATMENT, OR POSES SIGNIFICANT RISK TO THE PATIENT'S HEALTH: HISTORY OF THE PRESENT ILLNESS: Pt states few  mos of intermittent "blockage" of both eac's.  No assoc pain PAST MEDICAL HISTORY reviewed and up to date today REVIEW OF SYSTEMS: Denies hearing loss PHYSICAL EXAMINATION: Eac's: both are occluded with cerumen IMPRESSION: Cerumen impaction, new PLAN: bilat irrigation is done

## 2010-10-04 ENCOUNTER — Telehealth: Payer: Self-pay | Admitting: Endocrinology

## 2010-10-04 DIAGNOSIS — H6120 Impacted cerumen, unspecified ear: Secondary | ICD-10-CM | POA: Insufficient documentation

## 2010-10-04 DIAGNOSIS — K769 Liver disease, unspecified: Secondary | ICD-10-CM | POA: Insufficient documentation

## 2010-10-04 NOTE — Telephone Encounter (Signed)
i left message on phone tree Come back in a few weeks for repeat lft

## 2010-10-06 ENCOUNTER — Telehealth: Payer: Self-pay

## 2010-10-06 MED ORDER — OXYBUTYNIN CHLORIDE 5 MG PO TABS: 5.0000 mg | ORAL_TABLET | Freq: Three times a day (TID) | ORAL | Status: DC

## 2010-10-06 NOTE — Telephone Encounter (Signed)
ua was neg.  i am happy to rx a med for sxs.

## 2010-10-06 NOTE — Telephone Encounter (Signed)
sent 

## 2010-10-06 NOTE — Telephone Encounter (Signed)
Pt called stating she discussed increased urination with MD at last OV but was not advised of the possible cause and treatment. Pt is still experiencing this sxs and is requesting advisement.

## 2010-10-06 NOTE — Telephone Encounter (Signed)
Please see previous phone note. Pt is requesting MD send Rx to [facility] [address]

## 2010-10-27 ENCOUNTER — Other Ambulatory Visit (INDEPENDENT_AMBULATORY_CARE_PROVIDER_SITE_OTHER): Payer: Medicare Other

## 2010-10-27 DIAGNOSIS — K769 Liver disease, unspecified: Secondary | ICD-10-CM

## 2010-10-27 LAB — HEPATIC FUNCTION PANEL
ALT: 26 U/L (ref 0–35)
Alkaline Phosphatase: 49 U/L (ref 39–117)
Bilirubin, Direct: 0.1 mg/dL (ref 0.0–0.3)
Total Bilirubin: 0.8 mg/dL (ref 0.3–1.2)
Total Protein: 7 g/dL (ref 6.0–8.3)

## 2010-11-04 ENCOUNTER — Inpatient Hospital Stay (INDEPENDENT_AMBULATORY_CARE_PROVIDER_SITE_OTHER)
Admission: RE | Admit: 2010-11-04 | Discharge: 2010-11-04 | Disposition: A | Discharge status: Home / Self Care | Payer: Medicare Other | Source: Ambulatory Visit | Attending: Emergency Medicine | Admitting: Emergency Medicine

## 2010-11-04 DIAGNOSIS — N390 Urinary tract infection, site not specified: Secondary | ICD-10-CM

## 2010-11-04 LAB — POCT URINALYSIS DIP (DEVICE)
Nitrite: POSITIVE — AB
Protein, ur: >=300 mg/dL — AB
pH: 7 (ref 5.0–8.0)

## 2010-11-05 ENCOUNTER — Telehealth: Payer: Self-pay

## 2010-11-05 NOTE — Telephone Encounter (Signed)
[facility] Triage Call Report Triage Record Num: [id number] Operator: Hillary Bow Patient Name: Tanya Espinoza Call Date & Time: 11/04/2010 5:31:51PM Patient Phone: [phone] PCP: Romero Belling Patient Gender: Female PCP Fax : [phone] Patient DOB: Nov 12, 1940 Practice Name: [facility] Reason for Call: Pt calling about having blood in Urine w/ Bladder cramping, onset 11-03-10. Afebrile. All emergent sxs r/o per Bloody Urine Protocol. Advised Pt to f/u w/ Office on 11-05-10 for an appt, unable to call in ABX w/o UA, Pt persists I call MD on-call for ABX. Consulted w/ Dr Rodena Medin, advised Pt to be seen at [facility] for evaluation. Pt verbalized understanding. Protocol(s) Used: Bloody Urine Recommended Outcome per Protocol: See Provider within 24 hours Reason for Outcome: Has one or more urinary tract symptoms AND has not been previously evaluated Care Advice: ~ 11/04/2010 5:47:21PM Page 1 of 1 [email or web address]

## 2010-11-19 ENCOUNTER — Other Ambulatory Visit: Payer: Self-pay | Admitting: Endocrinology

## 2010-11-19 DIAGNOSIS — Z1231 Encounter for screening mammogram for malignant neoplasm of breast: Secondary | ICD-10-CM

## 2010-11-20 ENCOUNTER — Ambulatory Visit (INDEPENDENT_AMBULATORY_CARE_PROVIDER_SITE_OTHER): Payer: Medicare Other | Admitting: Endocrinology

## 2010-11-20 ENCOUNTER — Encounter: Payer: Self-pay | Admitting: Endocrinology

## 2010-11-20 VITALS — BP 118/78 | HR 63 | Temp 98.6°F | Ht 61.0 in | Wt 138.0 lb

## 2010-11-20 DIAGNOSIS — Z87448 Personal history of other diseases of urinary system: Secondary | ICD-10-CM

## 2010-11-20 DIAGNOSIS — N390 Urinary tract infection, site not specified: Secondary | ICD-10-CM

## 2010-11-20 LAB — POCT URINALYSIS DIPSTICK
Blood, UA: NEGATIVE
Glucose, UA: NEGATIVE
Spec Grav, UA: 1.01

## 2010-11-20 NOTE — Progress Notes (Signed)
  Subjective:    Patient ID: Tanya Espinoza, female    DOB: Feb 07, 1941, 70 y.o.   MRN: [id number]  HPI Pt was seen at [facility] for uti approx 2 weeks ago.  She feels better with abx, and is here for recheck.   Past Medical History  Diagnosis Date  . HYPERLIPIDEMIA 11/30/2006  . DEPRESSION 11/30/2006  . HYPERTENSION 11/30/2006  . BRADYCARDIA 08/23/2007  . PSORIASIS 11/30/2006  . OSTEOPOROSIS 11/30/2006  . ASYMPTOMATIC POSTMENOPAUSAL STATUS 08/23/2007  . UTI'S, HX OF 11/30/2006  pt says she gets uti's infrequently  No past surgical history on file.  History   Social History  . Marital Status: Married    Spouse Name: N/A    Number of Children: N/A  . Years of Education: N/A   Occupational History  . Insurance    Social History Main Topics  . Smoking status: Never Smoker   . Smokeless tobacco: Not on file  . Alcohol Use: No  . Drug Use: No  . Sexually Active: Not on file   Other Topics Concern  . Not on file   Social History Narrative  . No narrative on file    Current Outpatient Prescriptions on File Prior to Visit  Medication Sig Dispense Refill  . aspirin 81 MG tablet Take 81 mg by mouth daily.        Marland Kitchen oxybutynin (DITROPAN) 5 MG tablet Take 1 tablet (5 mg total) by mouth 3 (three) times daily.  60 tablet  11  . pimecrolimus (ELIDEL) 1 % cream Apply topically daily.        . rosuvastatin (CRESTOR) 40 MG tablet Take 40 mg by mouth at bedtime.        . triamterene-hydrochlorothiazide (MAXZIDE-25) 37.5-25 MG per tablet 1/2 tablet by mouth once daily         No Known Allergies  Family History  Problem Relation Age of Onset  . Cancer Sister     Breast Cancer    BP 118/78  Pulse 63  Temp(Src) 98.6 F (37 C) (Oral)  Ht 5\' 1"  (1.549 m)  Wt 138 lb (62.596 kg)  BMI 26.07 kg/m2  SpO2 99% Review of Systems Denies fever.    Objective:   Physical Exam VITAL SIGNS:  See vs page GENERAL: no distress Abd: no suprapubic tenderness.           Assessment & Plan:  Uti, resolved

## 2010-11-20 NOTE — Patient Instructions (Addendum)
Your urine infection is better.  See you next year, or sooner if you get sick--i hope not

## 2010-12-05 ENCOUNTER — Emergency Department (HOSPITAL_COMMUNITY)
Admission: EM | Admit: 2010-12-05 | Discharge: 2010-12-05 | Disposition: A | Discharge status: Home / Self Care | Payer: Medicare Other | Attending: Emergency Medicine | Admitting: Emergency Medicine

## 2010-12-05 DIAGNOSIS — R300 Dysuria: Secondary | ICD-10-CM | POA: Insufficient documentation

## 2010-12-05 DIAGNOSIS — E7800 Pure hypercholesterolemia, unspecified: Secondary | ICD-10-CM | POA: Insufficient documentation

## 2010-12-05 DIAGNOSIS — R319 Hematuria, unspecified: Secondary | ICD-10-CM | POA: Insufficient documentation

## 2010-12-05 DIAGNOSIS — I10 Essential (primary) hypertension: Secondary | ICD-10-CM | POA: Insufficient documentation

## 2010-12-05 DIAGNOSIS — N390 Urinary tract infection, site not specified: Secondary | ICD-10-CM | POA: Insufficient documentation

## 2010-12-05 LAB — URINE MICROSCOPIC-ADD ON

## 2010-12-05 LAB — POCT I-STAT, CHEM 8
HCT: 38 % (ref 36.0–46.0)
Hemoglobin: 12.9 g/dL (ref 12.0–15.0)
Sodium: 138 mEq/L (ref 135–145)
TCO2: 26 mmol/L (ref 0–100)

## 2010-12-05 LAB — URINALYSIS, ROUTINE W REFLEX MICROSCOPIC
Glucose, UA: NEGATIVE mg/dL
Protein, ur: >300 mg/dL — AB
Specific Gravity, Urine: 1.011 (ref 1.005–1.030)

## 2010-12-07 LAB — URINE CULTURE

## 2010-12-08 ENCOUNTER — Telehealth: Payer: Self-pay

## 2010-12-08 DIAGNOSIS — Z87448 Personal history of other diseases of urinary system: Secondary | ICD-10-CM

## 2010-12-08 NOTE — Telephone Encounter (Signed)
done

## 2010-12-08 NOTE — Telephone Encounter (Signed)
Pt informed

## 2010-12-08 NOTE — Telephone Encounter (Signed)
Pt called requesting referral to Urology per ER MD she saw Saturday for UTI.

## 2010-12-22 ENCOUNTER — Encounter: Payer: Self-pay | Admitting: Endocrinology

## 2010-12-22 ENCOUNTER — Ambulatory Visit (INDEPENDENT_AMBULATORY_CARE_PROVIDER_SITE_OTHER): Payer: Medicare Other | Admitting: Endocrinology

## 2010-12-22 VITALS — BP 122/78 | HR 70 | Temp 98.1°F | Ht 61.0 in | Wt 138.0 lb

## 2010-12-22 DIAGNOSIS — B029 Zoster without complications: Secondary | ICD-10-CM

## 2010-12-22 DIAGNOSIS — Z23 Encounter for immunization: Secondary | ICD-10-CM

## 2010-12-22 MED ORDER — TRIAMCINOLONE ACETONIDE 0.025 % EX CREA: TOPICAL_CREAM | Freq: Three times a day (TID) | CUTANEOUS | Status: AC

## 2010-12-22 MED ORDER — VALACYCLOVIR HCL 1 G PO TABS: 1000.0000 mg | ORAL_TABLET | Freq: Three times a day (TID) | ORAL | Status: DC

## 2010-12-22 NOTE — Patient Instructions (Addendum)
i have sent a prescription to your pharmacy, for an anti-virus pill and anti-itch cream.   I hope you feel better soon.  If you don't feel better in a few weeks, please call back.

## 2010-12-22 NOTE — Progress Notes (Signed)
  Subjective:    Patient ID: Tanya Espinoza, female    DOB: 1940-07-31, 70 y.o.   MRN: [id number]  HPI Pt states 1 day of moderate itching at the anterior left chest, and assoc rash.  She had zoster in the past--she had itching but no pain then.   Past Medical History  Diagnosis Date  . HYPERLIPIDEMIA 11/30/2006  . DEPRESSION 11/30/2006  . HYPERTENSION 11/30/2006  . BRADYCARDIA 08/23/2007  . PSORIASIS 11/30/2006  . OSTEOPOROSIS 11/30/2006  . ASYMPTOMATIC POSTMENOPAUSAL STATUS 08/23/2007  . UTI'S, HX OF 11/30/2006    No past surgical history on file.  History   Social History  . Marital Status: Married    Spouse Name: N/A    Number of Children: N/A  . Years of Education: N/A   Occupational History  . Insurance    Social History Main Topics  . Smoking status: Never Smoker   . Smokeless tobacco: Not on file  . Alcohol Use: No  . Drug Use: No  . Sexually Active: Not on file   Other Topics Concern  . Not on file   Social History Narrative  . No narrative on file    Current Outpatient Prescriptions on File Prior to Visit  Medication Sig Dispense Refill  . aspirin 81 MG tablet Take 81 mg by mouth daily.        Marland Kitchen oxybutynin (DITROPAN) 5 MG tablet Take 1 tablet (5 mg total) by mouth 3 (three) times daily.  60 tablet  11  . pimecrolimus (ELIDEL) 1 % cream Apply topically daily.        . rosuvastatin (CRESTOR) 40 MG tablet Take 40 mg by mouth at bedtime.        . triamterene-hydrochlorothiazide (MAXZIDE-25) 37.5-25 MG per tablet 1/2 tablet by mouth once daily         No Known Allergies  Family History  Problem Relation Age of Onset  . Cancer Sister     Breast Cancer    BP 122/78  Pulse 70  Temp(Src) 98.1 F (36.7 C) (Oral)  Ht 5\' 1"  (1.549 m)  Wt 138 lb (62.596 kg)  BMI 26.07 kg/m2  SpO2 96%  Review of Systems Denies fever    Objective:   Physical Exam VITAL SIGNS:  See vs page GENERAL: no distress Skin: approx 8 macules at the left neck and chest.  There are  none on the right side of the body.      Assessment & Plan:  Zoster, recurrent

## 2010-12-24 DIAGNOSIS — B029 Zoster without complications: Secondary | ICD-10-CM | POA: Insufficient documentation

## 2010-12-29 ENCOUNTER — Ambulatory Visit: Payer: Medicare Other

## 2011-01-02 ENCOUNTER — Other Ambulatory Visit: Payer: Self-pay | Admitting: Endocrinology

## 2011-01-05 ENCOUNTER — Ambulatory Visit
Admission: RE | Admit: 2011-01-05 | Discharge: 2011-01-05 | Disposition: A | Discharge status: Home / Self Care | Payer: Medicare Other | Source: Ambulatory Visit | Attending: Endocrinology | Admitting: Endocrinology

## 2011-01-05 DIAGNOSIS — Z1231 Encounter for screening mammogram for malignant neoplasm of breast: Secondary | ICD-10-CM

## 2011-05-27 DIAGNOSIS — N3941 Urge incontinence: Secondary | ICD-10-CM | POA: Diagnosis not present

## 2011-05-27 DIAGNOSIS — R351 Nocturia: Secondary | ICD-10-CM | POA: Diagnosis not present

## 2011-05-27 DIAGNOSIS — N3020 Other chronic cystitis without hematuria: Secondary | ICD-10-CM | POA: Diagnosis not present

## 2011-07-14 ENCOUNTER — Other Ambulatory Visit: Payer: Self-pay | Admitting: Endocrinology

## 2011-09-05 ENCOUNTER — Other Ambulatory Visit: Payer: Self-pay | Admitting: Endocrinology

## 2011-10-13 ENCOUNTER — Encounter: Payer: Medicare Other | Admitting: Endocrinology

## 2011-10-15 ENCOUNTER — Other Ambulatory Visit (INDEPENDENT_AMBULATORY_CARE_PROVIDER_SITE_OTHER): Payer: Medicare Other

## 2011-10-15 ENCOUNTER — Ambulatory Visit (INDEPENDENT_AMBULATORY_CARE_PROVIDER_SITE_OTHER): Payer: Medicare Other | Admitting: Endocrinology

## 2011-10-15 VITALS — BP 108/78 | HR 61 | Temp 97.2°F | Ht 61.0 in | Wt 133.0 lb

## 2011-10-15 DIAGNOSIS — Z79899 Other long term (current) drug therapy: Secondary | ICD-10-CM

## 2011-10-15 DIAGNOSIS — I10 Essential (primary) hypertension: Secondary | ICD-10-CM

## 2011-10-15 DIAGNOSIS — Z87448 Personal history of other diseases of urinary system: Secondary | ICD-10-CM

## 2011-10-15 DIAGNOSIS — E785 Hyperlipidemia, unspecified: Secondary | ICD-10-CM

## 2011-10-15 DIAGNOSIS — Z0000 Encounter for general adult medical examination without abnormal findings: Secondary | ICD-10-CM

## 2011-10-15 DIAGNOSIS — F329 Major depressive disorder, single episode, unspecified: Secondary | ICD-10-CM

## 2011-10-15 DIAGNOSIS — Z23 Encounter for immunization: Secondary | ICD-10-CM | POA: Diagnosis not present

## 2011-10-15 DIAGNOSIS — K769 Liver disease, unspecified: Secondary | ICD-10-CM | POA: Diagnosis not present

## 2011-10-15 DIAGNOSIS — M810 Age-related osteoporosis without current pathological fracture: Secondary | ICD-10-CM

## 2011-10-15 DIAGNOSIS — F3289 Other specified depressive episodes: Secondary | ICD-10-CM | POA: Diagnosis not present

## 2011-10-15 DIAGNOSIS — H6120 Impacted cerumen, unspecified ear: Secondary | ICD-10-CM

## 2011-10-15 LAB — HEPATIC FUNCTION PANEL
ALT: 24 U/L (ref 0–35)
AST: 29 U/L (ref 0–37)
Albumin: 3.8 g/dL (ref 3.5–5.2)
Alkaline Phosphatase: 46 U/L (ref 39–117)
Bilirubin, Direct: 0.1 mg/dL (ref 0.0–0.3)
Total Bilirubin: 0.4 mg/dL (ref 0.3–1.2)
Total Protein: 6.8 g/dL (ref 6.0–8.3)

## 2011-10-15 LAB — LIPID PANEL
Cholesterol: 132 mg/dL (ref 0–200)
LDL Cholesterol: 49 mg/dL (ref 0–99)
Total CHOL/HDL Ratio: 2

## 2011-10-15 LAB — CBC WITH DIFFERENTIAL/PLATELET
Basophils Absolute: 0 10*3/uL (ref 0.0–0.1)
Basophils Relative: 0.3 % (ref 0.0–3.0)
Eosinophils Absolute: 0 10*3/uL (ref 0.0–0.7)
Eosinophils Relative: 0.6 % (ref 0.0–5.0)
HCT: 35.7 % — ABNORMAL LOW (ref 36.0–46.0)
Hemoglobin: 11.9 g/dL — ABNORMAL LOW (ref 12.0–15.0)
Lymphocytes Relative: 17.5 % (ref 12.0–46.0)
Lymphs Abs: 1.4 10*3/uL (ref 0.7–4.0)
MCHC: 33.4 g/dL (ref 30.0–36.0)
MCV: 89.7 fl (ref 78.0–100.0)
Monocytes Absolute: 0.5 10*3/uL (ref 0.1–1.0)
Monocytes Relative: 6.3 % (ref 3.0–12.0)
Neutro Abs: 5.9 10*3/uL (ref 1.4–7.7)
Neutrophils Relative %: 75.3 % (ref 43.0–77.0)
Platelets: 198 10*3/uL (ref 150.0–400.0)
RBC: 3.98 Mil/uL (ref 3.87–5.11)
RDW: 15.1 % — ABNORMAL HIGH (ref 11.5–14.6)
WBC: 7.8 10*3/uL (ref 4.5–10.5)

## 2011-10-15 LAB — URINALYSIS, ROUTINE W REFLEX MICROSCOPIC
Bilirubin Urine: NEGATIVE
Hgb urine dipstick: NEGATIVE
Ketones, ur: NEGATIVE
Nitrite: NEGATIVE
Specific Gravity, Urine: 1.01
Urine Glucose: NEGATIVE
Urobilinogen, UA: 0.2
pH: 7 (ref 5.0–8.0)

## 2011-10-15 LAB — BASIC METABOLIC PANEL
BUN: 13 mg/dL (ref 6–23)
GFR: 97.03 mL/min (ref >60.00)
Potassium: 4.2 mEq/L (ref 3.5–5.1)
Sodium: 141 mEq/L (ref 135–145)

## 2011-10-15 LAB — TSH: TSH: 0.82 u[IU]/mL (ref 0.35–5.50)

## 2011-10-15 NOTE — Progress Notes (Signed)
Subjective:    Patient ID: Tanya Espinoza, female    DOB: [birth year], 71 y.o.   MRN: [id number]  HPI The state of at least three ongoing medical problems is addressed today: Dyslipidemia: Denies chest pain HTN: Denies sob Bradycardia: Denies dizziness Past Medical History  Diagnosis Date  . HYPERLIPIDEMIA 11/30/2006  . DEPRESSION 11/30/2006  . HYPERTENSION 11/30/2006  . BRADYCARDIA 08/23/2007  . PSORIASIS 11/30/2006  . OSTEOPOROSIS 11/30/2006  . ASYMPTOMATIC POSTMENOPAUSAL STATUS 08/23/2007  . UTI'S, HX OF 11/30/2006    No past surgical history on file.  History   Social History  . Marital Status: Married    Spouse Name: N/A    Number of Children: N/A  . Years of Education: N/A   Occupational History  . Insurance    Social History Main Topics  . Smoking status: Never Smoker   . Smokeless tobacco: Not on file  . Alcohol Use: No  . Drug Use: No  . Sexually Active: Not on file   Other Topics Concern  . Not on file   Social History Narrative  . No narrative on file    Current Outpatient Prescriptions on File Prior to Visit  Medication Sig Dispense Refill  . aspirin 81 MG tablet Take 81 mg by mouth daily.        . CRESTOR 40 MG tablet TAKE 1 TABLET AT BEDTIME  90 tablet  1  . pimecrolimus (ELIDEL) 1 % cream Apply topically daily as needed.       . triamcinolone (KENALOG) 0.025 % cream Apply topically 3 (three) times daily. As needed for itching  30 g  1  . triamterene-hydrochlorothiazide (MAXZIDE-25) 37.5-25 MG per tablet       . oxybutynin (DITROPAN) 5 MG tablet Take 1 tablet (5 mg total) by mouth 3 (three) times daily.  60 tablet  11    No Known Allergies  Family History  Problem Relation Age of Onset  . Cancer Sister     Breast Cancer    BP 108/78  Pulse 61  Temp 97.2 F (36.2 C) (Oral)  Ht 5\' 1"  (1.549 m)  Wt 133 lb (60.328 kg)  BMI 25.13 kg/m2  SpO2 96%  Review of Systems Depression is much better recently.  Denies edema recently    Objective:   Physical Exam VITAL SIGNS:  See vs page GENERAL: no distress NECK: There is no palpable thyroid enlargement.  No thyroid nodule is palpable.  No palpable lymphadenopathy at the anterior neck. LUNGS:  Clear to auscultation HEART:  Regular rate and rhythm without murmurs noted. Normal S1,S2.   Pulses: dorsalis pedis intact bilat.  No carotid bruit. Feet: no deformity.  no ulcer on the feet.  feet are of normal color and temp.  no edema Neuro: sensation is intact to touch on the feet  Lab Results  Component Value Date   WBC 7.8 10/15/2011   HGB 11.9* 10/15/2011   HCT 35.7* 10/15/2011   PLT 198.0 10/15/2011   GLUCOSE 94 10/15/2011   CHOL 132 10/15/2011   TRIG 31.0 10/15/2011   HDL 76.60 10/15/2011   LDLCALC 49 10/15/2011   ALT 24 10/15/2011   AST 29 10/15/2011   NA 141 10/15/2011   K 4.2 10/15/2011   CL 108 10/15/2011   CREATININE 0.8 10/15/2011   BUN 13 10/15/2011   CO2 27 10/15/2011   TSH 0.82 10/15/2011  i reviewed electrocardiogram    Assessment & Plan:  Anemia, new, mild, uncertain etiology HTN.  well-controlled  Bradycardia, stable and asymptomatic Dyslipidemia, well-controlled    Subjective:   Patient here for Medicare annual wellness visit and management of other chronic and acute problems.     Risk factors: advanced age    Roster of Physicians Providing Medical Care to Patient:  See "snapshot"   Activities of Daily Living: pt lives at home with husband. In your present state of health, do you have any difficulty performing the following activities?:  Preparing food and eating?: No  Bathing yourself: No  Getting dressed: No  Using the toilet: No  Moving around from place to place: No  In the past year have you fallen or had a near fall?: No    Home Safety: Has smoke detector and wears seat belts. No firearms. No excess sun exposure.  Diet and Exercise  Current exercise habits:  Pt says good Dietary issues discussed: pt reports a healthy diet   Depression Screen  Q1: Over the past two  weeks, have you felt down, depressed or hopeless? no  Q2: Over the past two weeks, have you felt little interest or pleasure in doing things? no   The following portions of the patient's history were reviewed and updated as appropriate: allergies, current medications, past family history, past medical history, past social history, past surgical history and problem list.  Past Medical History  Diagnosis Date  . HYPERLIPIDEMIA 11/30/2006  . DEPRESSION 11/30/2006  . HYPERTENSION 11/30/2006  . BRADYCARDIA 08/23/2007  . PSORIASIS 11/30/2006  . OSTEOPOROSIS 11/30/2006  . ASYMPTOMATIC POSTMENOPAUSAL STATUS 08/23/2007  . UTI'S, HX OF 11/30/2006    No past surgical history on file.  History   Social History  . Marital Status: Married    Spouse Name: N/A    Number of Children: N/A  . Years of Education: N/A   Occupational History  . Insurance    Social History Main Topics  . Smoking status: Never Smoker   . Smokeless tobacco: Not on file  . Alcohol Use: No  . Drug Use: No  . Sexually Active: Not on file   Other Topics Concern  . Not on file   Social History Narrative  . No narrative on file    Current Outpatient Prescriptions on File Prior to Visit  Medication Sig Dispense Refill  . aspirin 81 MG tablet Take 81 mg by mouth daily.        . CRESTOR 40 MG tablet TAKE 1 TABLET AT BEDTIME  90 tablet  1  . oxybutynin (DITROPAN) 5 MG tablet Take 1 tablet (5 mg total) by mouth 3 (three) times daily.  60 tablet  11  . pimecrolimus (ELIDEL) 1 % cream Apply topically daily.        Marland Kitchen triamcinolone (KENALOG) 0.025 % cream Apply topically 3 (three) times daily. As needed for itching  30 g  1  . triamterene-hydrochlorothiazide (MAXZIDE-25) 37.5-25 MG per tablet TAKE ONE-HALF TABLET BY MOUTH EVERY DAY  30 tablet  2  . valACYclovir (VALTREX) 1000 MG tablet Take 1 tablet (1,000 mg total) by mouth 3 (three) times daily.  21 tablet  0    No Known Allergies  Family History  Problem Relation Age of  Onset  . Cancer Sister     Breast Cancer    BP 108/78  Pulse 61  Temp 97.2 F (36.2 C) (Oral)  Ht 5\' 1"  (1.549 m)  Wt 133 lb (60.328 kg)  BMI 25.13 kg/m2  SpO2 96%   Review of Systems  Denies hearing loss, and  visual loss Objective:   Vision:  Sees opthalmologist Hearing: grossly normal Body mass index:  See vs page Msk: pt easily and quickly performs "get-up-and-go" from a sitting position. Cognitive Impairment Assessment: cognition, memory and judgment appear normal.  remembers 3/3 at 5 minutes.  excellent recall.  can easily read and write a sentence.  alert and oriented x 3   Assessment:   Medicare wellness utd on preventive parameters    Plan:   During the course of the visit the patient was educated and counseled about appropriate screening and preventive services including:        Fall prevention   Screening mammography  Bone densitometry screening  Diabetes screening  Nutrition counseling   Vaccines / LABS Zostavax / Pnemonccoal Vaccine  today   Patient Instructions (the written plan) was given to the patient.

## 2011-10-15 NOTE — Patient Instructions (Addendum)
please consider these measures for your health:  minimize alcohol.  do not use tobacco products.  have a colonoscopy at least every 10 years from age 69.  Women should have an annual mammogram from age 22.  keep firearms safely stored.  always use seat belts.  have working smoke alarms in your home.  see an eye doctor and dentist regularly.  never drive under the influence of alcohol or drugs (including prescription drugs).   please let me know what your wishes would be, if artificial life support measures should become necessary.  it is critically important to prevent falling down (keep floor areas well-lit, dry, and free of loose objects.  If you have a cane, walker, or wheelchair, you should use it, even for short trips around the house.  Also, try not to rush).  blood tests are being requested for you today.  You will receive a letter with results.   Let's recheck the bone-density test.   Please return in 1 year.   Reduce the triamterene/hctz to 3x a week.

## 2011-10-30 ENCOUNTER — Ambulatory Visit (INDEPENDENT_AMBULATORY_CARE_PROVIDER_SITE_OTHER)
Admission: RE | Admit: 2011-10-30 | Discharge: 2011-10-30 | Disposition: A | Discharge status: Home / Self Care | Payer: Medicare Other | Source: Ambulatory Visit

## 2011-10-30 DIAGNOSIS — M810 Age-related osteoporosis without current pathological fracture: Secondary | ICD-10-CM | POA: Diagnosis not present

## 2011-11-11 DIAGNOSIS — H35379 Puckering of macula, unspecified eye: Secondary | ICD-10-CM | POA: Diagnosis not present

## 2011-11-11 DIAGNOSIS — H2510 Age-related nuclear cataract, unspecified eye: Secondary | ICD-10-CM | POA: Diagnosis not present

## 2011-11-11 DIAGNOSIS — Z961 Presence of intraocular lens: Secondary | ICD-10-CM | POA: Diagnosis not present

## 2011-11-16 ENCOUNTER — Encounter: Payer: Self-pay | Admitting: Endocrinology

## 2011-11-16 ENCOUNTER — Other Ambulatory Visit: Payer: Self-pay | Admitting: Endocrinology

## 2011-11-16 DIAGNOSIS — N19 Unspecified kidney failure: Secondary | ICD-10-CM

## 2011-11-17 ENCOUNTER — Telehealth: Payer: Self-pay | Admitting: Endocrinology

## 2011-11-17 DIAGNOSIS — D649 Anemia, unspecified: Secondary | ICD-10-CM | POA: Insufficient documentation

## 2011-11-17 NOTE — Telephone Encounter (Signed)
Pt is aware and will come on Friday.

## 2011-11-17 NOTE — Telephone Encounter (Signed)
Pt wants to know if she is to come for labs to check anemia.  She got a letter in the mail stating to come.

## 2011-11-17 NOTE — Telephone Encounter (Signed)
Yes, i ordered 

## 2011-11-20 ENCOUNTER — Other Ambulatory Visit (INDEPENDENT_AMBULATORY_CARE_PROVIDER_SITE_OTHER): Payer: Medicare Other

## 2011-11-20 DIAGNOSIS — D649 Anemia, unspecified: Secondary | ICD-10-CM | POA: Diagnosis not present

## 2011-11-20 LAB — IBC PANEL
Saturation Ratios: 17.4 % — ABNORMAL LOW (ref 20.0–50.0)
Transferrin: 229.5 mg/dL (ref 212.0–360.0)

## 2011-11-20 LAB — CBC WITH DIFFERENTIAL/PLATELET
Basophils Relative: 0.5 % (ref 0.0–3.0)
Eosinophils Relative: 0.5 % (ref 0.0–5.0)
Hemoglobin: 12.1 g/dL (ref 12.0–15.0)
Lymphocytes Relative: 23.6 % (ref 12.0–46.0)
Monocytes Relative: 5.4 % (ref 3.0–12.0)
Neutro Abs: 4.6 10*3/uL (ref 1.4–7.7)
Neutrophils Relative %: 70 % (ref 43.0–77.0)
RBC: 4.19 Mil/uL (ref 3.87–5.11)
WBC: 6.6 10*3/uL (ref 4.5–10.5)

## 2011-11-21 ENCOUNTER — Encounter: Payer: Self-pay | Admitting: Endocrinology

## 2011-11-24 ENCOUNTER — Telehealth: Payer: Self-pay | Admitting: *Deleted

## 2011-11-24 NOTE — Telephone Encounter (Signed)
Called pt to inform of lab results, pt informed via VM and to callback office with any questions/concerns (letter also mailed to pt).  

## 2011-12-03 ENCOUNTER — Other Ambulatory Visit: Payer: Self-pay | Admitting: Endocrinology

## 2011-12-03 DIAGNOSIS — Z1231 Encounter for screening mammogram for malignant neoplasm of breast: Secondary | ICD-10-CM

## 2011-12-09 DIAGNOSIS — H2510 Age-related nuclear cataract, unspecified eye: Secondary | ICD-10-CM | POA: Diagnosis not present

## 2011-12-14 DIAGNOSIS — H25019 Cortical age-related cataract, unspecified eye: Secondary | ICD-10-CM | POA: Diagnosis not present

## 2011-12-14 DIAGNOSIS — H25049 Posterior subcapsular polar age-related cataract, unspecified eye: Secondary | ICD-10-CM | POA: Diagnosis not present

## 2011-12-14 DIAGNOSIS — H2510 Age-related nuclear cataract, unspecified eye: Secondary | ICD-10-CM | POA: Diagnosis not present

## 2012-01-01 DIAGNOSIS — Z23 Encounter for immunization: Secondary | ICD-10-CM | POA: Diagnosis not present

## 2012-01-08 ENCOUNTER — Ambulatory Visit: Payer: Medicare Other

## 2012-01-19 ENCOUNTER — Ambulatory Visit
Admission: RE | Admit: 2012-01-19 | Discharge: 2012-01-19 | Disposition: A | Discharge status: Home / Self Care | Payer: Medicare Other | Source: Ambulatory Visit | Attending: Endocrinology | Admitting: Endocrinology

## 2012-01-19 DIAGNOSIS — Z1231 Encounter for screening mammogram for malignant neoplasm of breast: Secondary | ICD-10-CM | POA: Diagnosis not present

## 2012-02-18 ENCOUNTER — Telehealth: Payer: Self-pay | Admitting: Endocrinology

## 2012-02-18 NOTE — Telephone Encounter (Signed)
Kidney blood and urine tests were normal at [facility] in august

## 2012-02-18 NOTE — Telephone Encounter (Signed)
Left message on machine pt to call if any questions 

## 2012-02-18 NOTE — Telephone Encounter (Signed)
Patient is concerned that her kidney problems were not addressed in OV. Please contact patient.

## 2012-02-25 ENCOUNTER — Telehealth: Payer: Self-pay

## 2012-02-25 NOTE — Telephone Encounter (Signed)
Message copied by Sharlyne Pacas on Thu Feb 25, 2012  9:28 AM ------      Message from: Janee Morn      Created: Thu Feb 25, 2012  9:17 AM       [facility] called pt to schedule appt. Pt is not sure why he needs appt. Need to call pt and explain why he was referred and have him call [facility] @ [phone] (Pam) and schedule appt.            Thanks

## 2012-02-25 NOTE — Telephone Encounter (Signed)
CALLED PATIENT CELL # AND HOME # LEFT MESSAGE ON VM . THAT DR. ELLISON STATES DO NOT HAVE TO HAVE APPT. WITH [facility]. CALL BACK IF ANY QUESTION,

## 2012-02-25 NOTE — Telephone Encounter (Signed)
Ok to not have the appt

## 2012-02-26 ENCOUNTER — Telehealth: Payer: Self-pay | Admitting: *Deleted

## 2012-02-26 NOTE — Telephone Encounter (Signed)
PATIENT AWARE OF NO NEED TO HAVE APPT. AT [facility]. PER DR. ELLISON. PATIENT STATES SHE IS HAVING NO KIDNEY PROBLEMS AT THIS TIME.

## 2012-04-16 ENCOUNTER — Encounter: Payer: Self-pay | Admitting: Internal Medicine

## 2012-04-16 ENCOUNTER — Ambulatory Visit (INDEPENDENT_AMBULATORY_CARE_PROVIDER_SITE_OTHER): Payer: Medicare Other | Admitting: Internal Medicine

## 2012-04-16 VITALS — BP 126/82 | HR 71 | Temp 98.0°F | Wt 135.0 lb

## 2012-04-16 DIAGNOSIS — L509 Urticaria, unspecified: Secondary | ICD-10-CM | POA: Diagnosis not present

## 2012-04-16 DIAGNOSIS — L299 Pruritus, unspecified: Secondary | ICD-10-CM

## 2012-04-16 DIAGNOSIS — L853 Xerosis cutis: Secondary | ICD-10-CM

## 2012-04-16 DIAGNOSIS — L258 Unspecified contact dermatitis due to other agents: Secondary | ICD-10-CM

## 2012-04-16 MED ORDER — PREDNISONE 20 MG PO TABS: 20.0000 mg | ORAL_TABLET | Freq: Two times a day (BID) | ORAL | Status: DC

## 2012-04-16 MED ORDER — HYDROXYZINE HCL 10 MG PO TABS: 10.0000 mg | ORAL_TABLET | ORAL | Status: DC | PRN

## 2012-04-16 NOTE — Patient Instructions (Addendum)
Use hypoallergenic cleansing motions. Mix Cort Aid 1 to 1 with Eucerin  & apply twice a day  for the dry skin. Bathe with moisturizing liquid soap , not bar soap.Do not get this mixture  into eyes .  Do not drive while taking hydroxyzine for itching.  Fill the prescription for prednisone facial swelling persists or progresses despite the hydroxyzine & the topical agents above

## 2012-04-16 NOTE — Progress Notes (Signed)
  Subjective:    Patient ID: Tanya Espinoza, female    DOB: [birth year], 72 y.o.   MRN: [id number]  HPI Skin lesions began 04/12/12 as dry skin & itching w/o rash. Redness & dryness were localized to L  Face but progressed to R face & forehead. The changes were described as pruritic,non tender  & associated with increase in temperature . The dermatologic lesions have progressed; entire face swollen & itching today Treatment with Elidil was not effective  There was no associated trigger or context such as exposure to animals, ticks/insects, new medications, clothing or topical cosmetics, travel, or occupational factors             Review of Systems Constitutional: no fever ; chills; change in weight ; excessive fatigue Allergy/immunologic: no sneezing ; rhinitis ; angioedema ; itchy eyes ; hives Eyes: no blurred/double/loss of vision  ; redness ; discharge Ears, nose and throat/mouth: no nasal congestion ; sore throat ; earache ;  facial pain ;frontal headache  Respirations:no cough ; sputum  ; dyspnea; wheezing GI:no  diarrhea ; abdominal pain   GU: no dysuria ; discharge  Musculoskeletal: no joint swelling or redness  Neurologic:no numbness or tingling  Hematologic/lymphatic:no abnormal bruising or bleeding ; enlarged lymph nodes     Objective:   Physical Exam General appearance:good health ;well nourished; no acute distress or increased work of breathing is present.  No  lymphadenopathy about the head, neck, or axilla noted.   Eyes: No conjunctival inflammation or lid edema is present. Arcus senilis.There is no scleral icterus. Vision is intact  Ears:  External ear exam shows no significant lesions or deformities.  Otoscopic examination reveals some wax bilaterally.  Nose:  External nasal examination shows no deformity or inflammation. Nasal mucosa are pink and moist without lesions or exudates. No septal dislocation or deviation.No obstruction to airflow.   Oral exam:  Dental hygiene is good; lips and gums are healthy appearing.There is no oropharyngeal erythema or exudate noted.   Neck:  No deformities, thyromegaly, masses, or tenderness noted.   Supple with full range of motion without pain.   Heart:  Normal rate and regular rhythm. S1 and S2 normal without gallop,  click, rub or other extra sounds. Grade 1/6 systolic murmur   Lungs:Chest clear to auscultation; no wheezes, rhonchi,rales ,or rubs present.No increased work of breathing.    Extremities:  No cyanosis, edema, or clubbing  noted    Skin: Warm & dry w/o jaundice or tenting. There is slight asymmetric periorbital edema greater on the left. The skin is dry without evidence of change in temperature or active cellulitis. These changes are most notable over the left mandibular area          Assessment & Plan:   #1 facial dryness, pruritus, and mild swelling. This preceded initiation of Elidil. There is no evidence of angioedema clinically. There are no changes below the neck.  She is on triamterene hydrochlorothiazide which would raise the possibility of a photosensitivity reaction; except for the present weather conditions. She is not on ACE inhibitor.  Plan: See orders and recommendations.

## 2012-05-23 ENCOUNTER — Other Ambulatory Visit: Payer: Self-pay | Admitting: Endocrinology

## 2012-05-26 DIAGNOSIS — N3941 Urge incontinence: Secondary | ICD-10-CM | POA: Diagnosis not present

## 2012-05-26 DIAGNOSIS — N3020 Other chronic cystitis without hematuria: Secondary | ICD-10-CM | POA: Diagnosis not present

## 2012-06-27 ENCOUNTER — Telehealth: Payer: Self-pay | Admitting: Endocrinology

## 2012-06-27 ENCOUNTER — Ambulatory Visit (INDEPENDENT_AMBULATORY_CARE_PROVIDER_SITE_OTHER): Payer: Medicare Other | Admitting: Family Medicine

## 2012-06-27 VITALS — BP 118/80 | HR 81 | Temp 98.3°F | Resp 16 | Ht 60.5 in | Wt 132.6 lb

## 2012-06-27 DIAGNOSIS — L299 Pruritus, unspecified: Secondary | ICD-10-CM | POA: Diagnosis not present

## 2012-06-27 LAB — BASIC METABOLIC PANEL
CO2: 27 mEq/L (ref 19–32)
Calcium: 10.6 mg/dL — ABNORMAL HIGH (ref 8.4–10.5)
Chloride: 104 mEq/L (ref 96–112)
Creat: 0.81 mg/dL (ref 0.50–1.10)
Glucose, Bld: 102 mg/dL — ABNORMAL HIGH (ref 70–99)

## 2012-06-27 MED ORDER — PREDNISONE 20 MG PO TABS: ORAL_TABLET | ORAL | Status: DC

## 2012-06-27 NOTE — Patient Instructions (Addendum)
Use the prednisone as directed. You can also use benadryl, and topical medications as needed.  Let me know if your are not better in the next couple of days- Sooner if worse.

## 2012-06-27 NOTE — Telephone Encounter (Signed)
ELLISON PT, woke up w. Red spots - believes she may have had allergic reaction. Please call patient back / Sherri

## 2012-06-27 NOTE — Telephone Encounter (Signed)
Called pt and lvm that Dr Everardo All is out of the office until Wednesday and she should try benadryl and use according to the directions on the container. If she continues to have a problem or feels she needs to see a physician, she can call the primary care office on [address] at [phone] and schedule to see one of the physicians there.

## 2012-06-27 NOTE — Progress Notes (Signed)
[facility] [address], [place] [address] [phone]  Date:  06/27/2012   Name:  Tanya Espinoza   DOB:  [birth year]   MRN:  [id number]  PCP:  Tanya Belling, MD    Chief Complaint: Rash   History of Present Illness:  TERSA Espinoza is a 72 y.o. very pleasant female patient who presents with the following:  Here today to evaluate a rash.  Taking crestor, aspirin and maxzide.  She was out of town over the week ned- staying with her sister in [place] because her nephew had surgery.  On Saturday (today is Monday) she started to note some itching on her wrists.  The itching then spread to her face, and this morning she noted a rash in her bilateral groin area. She has taken some benadryl.  She does tend to have sensitive skin and had used prednisone in February for a facial rash with good results.    She has not noted any SOB or difficulty breathing.   She has had this sort of problem on occasion in the past and wonders if she should have allergy testing.   Her PCP is Dr. Everardo Espinoza  Patient Active Problem List  Diagnosis  . HYPERLIPIDEMIA  . DEPRESSION  . HYPERTENSION  . BRADYCARDIA  . PSORIASIS  . OSTEOPOROSIS  . EDEMA  . HEADACHE  . UTI'S, HX OF  . ASYMPTOMATIC POSTMENOPAUSAL STATUS  . Encounter for long-term (current) use of other medications  . Cerumen impaction  . Unspecified disorder of liver  . Zoster  . Anemia, unspecified    Past Medical History  Diagnosis Date  . HYPERLIPIDEMIA 11/30/2006  . DEPRESSION 11/30/2006  . HYPERTENSION 11/30/2006  . BRADYCARDIA 08/23/2007  . PSORIASIS 11/30/2006  . OSTEOPOROSIS 11/30/2006  . ASYMPTOMATIC POSTMENOPAUSAL STATUS 08/23/2007  . UTI'S, HX OF 11/30/2006    Past Surgical History  Procedure Laterality Date  . Eye surgery      History  Substance Use Topics  . Smoking status: Never Smoker   . Smokeless tobacco: Never Used  . Alcohol Use: No    Family History  Problem Relation Age of Onset  .  Cancer Sister     Breast Cancer    No Known Allergies  Medication list has been reviewed and updated.  Current Outpatient Prescriptions on File Prior to Visit  Medication Sig Dispense Refill  . aspirin 81 MG tablet Take 81 mg by mouth daily.        . CRESTOR 40 MG tablet TAKE 1 TABLET AT BEDTIME  90 tablet  1  . triamterene-hydrochlorothiazide (MAXZIDE-25) 37.5-25 MG per tablet 1/2 by mouth every other day      . hydrOXYzine (ATARAX/VISTARIL) 10 MG tablet Take 1 tablet (10 mg total) by mouth every 4 (four) hours as needed for itching.  30 tablet  0  . oxybutynin (DITROPAN) 5 MG tablet Take 5 mg by mouth daily.      . pimecrolimus (ELIDEL) 1 % cream Apply topically daily as needed.       . predniSONE (DELTASONE) 20 MG tablet Take 1 tablet (20 mg total) by mouth 2 (two) times daily.  14 tablet  0  . triamterene-hydrochlorothiazide (MAXZIDE-25) 37.5-25 MG per tablet TAKE ONE-HALF TABLET BY MOUTH EVERY DAY  30 tablet  0   No current facility-administered medications on file prior to visit.    Review of Systems:  As per HPI- otherwise negative.   Physical Examination: Filed Vitals:  06/27/12 1324  BP: 118/80  Pulse: 81  Temp: 98.3 F (36.8 C)  Resp: 16   Filed Vitals:   06/27/12 1324  Height: 5' 0.5" (1.537 m)  Weight: 132 lb 9.6 oz (60.147 kg)   Body mass index is 25.46 kg/(m^2). Ideal Body Weight: Weight in (lb) to have BMI = 25: 129.9  GEN: WDWN, NAD, Non-toxic, A & O x 3 HEENT: Atraumatic, Normocephalic. Neck supple. No masses, No LAD.  Bilateral TM wnl, oropharynx normal.  PEERL,EOMI.   Ears and Nose: No external deformity. CV: RRR, No M/G/R. No JVD. No thrill. No extra heart sounds. PULM: CTA B, no wheezes, crackles, rhonchi. No retractions. No resp. distress. No accessory muscle use. EXTR: No c/c/e NEURO Normal gait.  PSYCH: Normally interactive. Conversant. Not depressed or anxious appearing.  Calm demeanor.  She has sparse scattered papules- slightly  erathematous- on the dorsum of both wrists, on her upper arms and on her forehead.  The papules are more confluent on her inner thighs/ groin bilaterally and around her navel.   May be hives but this is not certain.     Assessment and Plan: Itching - Plan: predniSONE (DELTASONE) 20 MG tablet, Basic metabolic panel  Itchy skin eruption, may be allergic or stress medicated.  As she is quite itchy will use prednisone for 8 days.  May continue to use benadryl and topicals as needed. Let me know if not better in the next couple of days.   Offered to refer for skin testing but she is not sure if she wants to do this yet.   Suggested that she hold her aspirin while she is on the prednisone, as she is not taking it for any particular concern  Signed Abbe Amsterdam, MD

## 2012-06-28 ENCOUNTER — Encounter: Payer: Self-pay | Admitting: Family Medicine

## 2012-06-28 NOTE — Addendum Note (Signed)
Addended by: Abbe Amsterdam C on: 06/28/2012 07:47 AM   Modules accepted: Orders

## 2012-06-29 ENCOUNTER — Telehealth: Payer: Self-pay

## 2012-06-29 DIAGNOSIS — L509 Urticaria, unspecified: Secondary | ICD-10-CM

## 2012-06-29 MED ORDER — EPINEPHRINE 0.3 MG/0.3ML IJ DEVI: 0.3000 mg | Freq: Once | INTRAMUSCULAR | Status: DC

## 2012-06-29 NOTE — Telephone Encounter (Signed)
Called her back- she continues to have some rash which sounds like hives and which comes and goes. It is moving around on her body, but is not as itchy.  Encouraged her to continue her benadryl, and rx an epipen for her to have on hand.  Discussed epipen use.  She will let me know if not better in the next few days, Sooner if worse.

## 2012-06-29 NOTE — Telephone Encounter (Signed)
Dr. Patsy Lager  Patient is breaking out in new areas,  The medicine is helping with the itch, concerned and would like to talk with you.   CBN:  [phone]

## 2012-06-29 NOTE — Telephone Encounter (Signed)
Called her, she states rash is now on leg and her back, some of it is drying up. She is taking the prednisone, she is on day 3. She d/c benadryl yesterday, ran out. I advised her to go back on this, she wants to know what else you can offer.

## 2012-07-01 ENCOUNTER — Telehealth: Payer: Self-pay

## 2012-07-01 DIAGNOSIS — R21 Rash and other nonspecific skin eruption: Secondary | ICD-10-CM

## 2012-07-01 NOTE — Telephone Encounter (Signed)
Pt seen Dr. Patsy Lager in her last visit and she was told if she was wanting to schedule allergy test to call back. If she don't answer call  Cell phone on [phone]

## 2012-07-01 NOTE — Telephone Encounter (Signed)
Patient wants to proceed with allergy testing

## 2012-07-01 NOTE — Telephone Encounter (Signed)
That is fine, we will schedule allergy testing

## 2012-07-04 ENCOUNTER — Telehealth: Payer: Self-pay

## 2012-07-04 NOTE — Telephone Encounter (Signed)
PT WOULD LIKE TO SPEAK WITH DR COPLAND ABOUT THE RASH SHE IS STILL HAVING PLEASE CALL [phone]

## 2012-07-04 NOTE — Telephone Encounter (Signed)
PT STATES SHE HAD CALLED THE OTHER DAY FOR DR COPLAND TO GIVE HER A CALL BACK REGARDING SOME QUESTIONS SHE WANTED TO ASK AND NEVER RECEIVED A CALL BACK PLEASE CALL [phone] OR HER CELL AT [phone]

## 2012-07-04 NOTE — Telephone Encounter (Signed)
She is taking Benadryl, advised her to try Zyrtec, she will do this also. She states she wants to know what else she can do for the rash, requesting you call her back.

## 2012-07-04 NOTE — Telephone Encounter (Signed)
Called her to advise allergy testing has been ordered.

## 2012-07-04 NOTE — Telephone Encounter (Signed)
Called her back- she is currently clear, but intermittently is breaking out in a localized patch of hives that will disappear on its own.  Otherwise she feels well.  She has allergist appt pending on 07/15/12.  Encouraged her to continue her zyrtec during the day, and may add zantac.    Benadryl at night if needed.  Otherwise I think the best course of action is to await her allergy testing, but asked her to follow- up if she gets worse

## 2012-07-04 NOTE — Telephone Encounter (Signed)
Patient states she has had rash, this comes and goes. She states this am the rash was on her face and now it is gone. States it has been on her leg, comes and goes, wants to know what else she can do for the rash until the allergy testing can be done. Please advise.

## 2012-07-04 NOTE — Telephone Encounter (Signed)
Message sent to Dr Patsy Lager earlier. Called patient advised Dr Patsy Lager is with patients currently, advised her to return to clinic if her need is urgent, or if she is worse, she should come back now. She states not getting worse, just still the same, she is anxious to speak to you, but does not want to return for recheck. To you, again.

## 2012-07-11 DIAGNOSIS — H1045 Other chronic allergic conjunctivitis: Secondary | ICD-10-CM | POA: Diagnosis not present

## 2012-07-11 DIAGNOSIS — L509 Urticaria, unspecified: Secondary | ICD-10-CM | POA: Diagnosis not present

## 2012-07-11 DIAGNOSIS — J309 Allergic rhinitis, unspecified: Secondary | ICD-10-CM | POA: Diagnosis not present

## 2012-07-13 ENCOUNTER — Other Ambulatory Visit: Payer: Self-pay | Admitting: Endocrinology

## 2012-07-13 ENCOUNTER — Other Ambulatory Visit: Payer: Self-pay

## 2012-08-06 ENCOUNTER — Other Ambulatory Visit: Payer: Self-pay | Admitting: Endocrinology

## 2012-08-09 ENCOUNTER — Telehealth: Payer: Self-pay | Admitting: Endocrinology

## 2012-08-09 MED ORDER — TRIAMTERENE-HCTZ 37.5-25 MG PO TABS: 1.0000 | ORAL_TABLET | Freq: Every day | ORAL | Status: DC

## 2012-08-09 NOTE — Telephone Encounter (Signed)
Needs refill on BP meds, pharmacy - [facility]. CB# [phone] / Roanna Raider

## 2012-10-24 ENCOUNTER — Encounter: Payer: Self-pay | Admitting: Endocrinology

## 2012-10-24 ENCOUNTER — Ambulatory Visit (INDEPENDENT_AMBULATORY_CARE_PROVIDER_SITE_OTHER): Payer: Medicare Other | Admitting: Endocrinology

## 2012-10-24 VITALS — BP 126/80 | HR 65 | Ht 61.0 in | Wt 135.0 lb

## 2012-10-24 DIAGNOSIS — Z79899 Other long term (current) drug therapy: Secondary | ICD-10-CM

## 2012-10-24 DIAGNOSIS — M810 Age-related osteoporosis without current pathological fracture: Secondary | ICD-10-CM | POA: Diagnosis not present

## 2012-10-24 DIAGNOSIS — E785 Hyperlipidemia, unspecified: Secondary | ICD-10-CM | POA: Diagnosis not present

## 2012-10-24 DIAGNOSIS — Z0000 Encounter for general adult medical examination without abnormal findings: Secondary | ICD-10-CM

## 2012-10-24 DIAGNOSIS — I498 Other specified cardiac arrhythmias: Secondary | ICD-10-CM | POA: Diagnosis not present

## 2012-10-24 DIAGNOSIS — D649 Anemia, unspecified: Secondary | ICD-10-CM

## 2012-10-24 DIAGNOSIS — I10 Essential (primary) hypertension: Secondary | ICD-10-CM

## 2012-10-24 DIAGNOSIS — L299 Pruritus, unspecified: Secondary | ICD-10-CM

## 2012-10-24 LAB — CBC WITH DIFFERENTIAL/PLATELET
Basophils Absolute: 0 10*3/uL (ref 0.0–0.1)
HCT: 39.1 % (ref 36.0–46.0)
Hemoglobin: 13.1 g/dL (ref 12.0–15.0)
Lymphs Abs: 1.5 10*3/uL (ref 0.7–4.0)
Monocytes Relative: 6.4 % (ref 3.0–12.0)
Neutro Abs: 5.7 10*3/uL (ref 1.4–7.7)
RDW: 14.4 % (ref 11.5–14.6)

## 2012-10-24 LAB — BASIC METABOLIC PANEL
BUN: 18 mg/dL (ref 6–23)
CO2: 28 mEq/L (ref 19–32)
Calcium: 10 mg/dL (ref 8.4–10.5)
Creatinine, Ser: 0.9 mg/dL (ref 0.4–1.2)
Glucose, Bld: 88 mg/dL (ref 70–99)

## 2012-10-24 LAB — URINALYSIS, ROUTINE W REFLEX MICROSCOPIC
Bilirubin Urine: NEGATIVE
Ketones, ur: NEGATIVE
Leukocytes, UA: NEGATIVE
Total Protein, Urine: NEGATIVE
Urine Glucose: NEGATIVE
pH: 6 (ref 5.0–8.0)

## 2012-10-24 LAB — LIPID PANEL
Cholesterol: 157 mg/dL (ref 0–200)
HDL: 82.1 mg/dL (ref >39.00)
Triglycerides: 77 mg/dL (ref 0.0–149.0)

## 2012-10-24 LAB — HEPATIC FUNCTION PANEL
ALT: 25 U/L (ref 0–35)
AST: 29 U/L (ref 0–37)
Total Protein: 7.4 g/dL (ref 6.0–8.3)

## 2012-10-24 LAB — IBC PANEL: Saturation Ratios: 21.8 % (ref 20.0–50.0)

## 2012-10-24 MED ORDER — TRIAMTERENE-HCTZ 37.5-25 MG PO TABS: ORAL_TABLET | ORAL | Status: DC

## 2012-10-24 MED ORDER — ROSUVASTATIN CALCIUM 40 MG PO TABS: 40.0000 mg | ORAL_TABLET | Freq: Every day | ORAL | Status: DC

## 2012-10-24 NOTE — Patient Instructions (Signed)
please consider these measures for your health:  minimize alcohol.  do not use tobacco products.  have a colonoscopy at least every 10 years from age 70.  Women should have an annual mammogram from age 24.  keep firearms safely stored.  always use seat belts.  have working smoke alarms in your home.  see an eye doctor and dentist regularly.  never drive under the influence of alcohol or drugs (including prescription drugs).   please let me know what your wishes would be, if artificial life support measures should become necessary.  it is critically important to prevent falling down (keep floor areas well-lit, dry, and free of loose objects.  If you have a cane, walker, or wheelchair, you should use it, even for short trips around the house.  Also, try not to rush). blood tests are being requested for you today.  We'll contact you with results. Please return in 1 year.    you will receive a phone call, about a day and time for an appointment for your colonoscopy.

## 2012-10-24 NOTE — Progress Notes (Signed)
Subjective:    Patient ID: Tanya Espinoza, female    DOB: April 27, 1940, 72 y.o.   MRN: [id number]  HPI Pt is here for regular wellness examination, and is feeling pretty well in general, and says chronic med probs are stable, except as noted below Past Medical History  Diagnosis Date  . HYPERLIPIDEMIA 11/30/2006  . DEPRESSION 11/30/2006  . HYPERTENSION 11/30/2006  . BRADYCARDIA 08/23/2007  . PSORIASIS 11/30/2006  . OSTEOPOROSIS 11/30/2006  . ASYMPTOMATIC POSTMENOPAUSAL STATUS 08/23/2007  . UTI'S, HX OF 11/30/2006    Past Surgical History  Procedure Laterality Date  . Eye surgery      History   Social History  . Marital Status: Married    Spouse Name: N/A    Number of Children: N/A  . Years of Education: N/A   Occupational History  . Insurance    Social History Main Topics  . Smoking status: Never Smoker   . Smokeless tobacco: Never Used  . Alcohol Use: No  . Drug Use: No  . Sexual Activity: Yes    Birth Control/ Protection: None   Other Topics Concern  . Not on file   Social History Narrative  . No narrative on file    Current Outpatient Prescriptions on File Prior to Visit  Medication Sig Dispense Refill  . aspirin 81 MG tablet Take 81 mg by mouth daily.        Marland Kitchen EPINEPHrine (EPIPEN) 0.3 mg/0.3 mL DEVI Inject 0.3 mLs (0.3 mg total) into the muscle once.  2 Device  1  . hydrOXYzine (ATARAX/VISTARIL) 10 MG tablet Take 1 tablet (10 mg total) by mouth every 4 (four) hours as needed for itching.  30 tablet  0  . oxybutynin (DITROPAN) 5 MG tablet Take 5 mg by mouth daily.      . pimecrolimus (ELIDEL) 1 % cream Apply topically daily as needed.        No current facility-administered medications on file prior to visit.    No Known Allergies  Family History  Problem Relation Age of Onset  . Cancer Sister     Breast Cancer    BP 126/80  Pulse 65  Ht 5\' 1"  (1.549 m)  Wt 135 lb (61.236 kg)  BMI 25.52 kg/m2  SpO2 97%     Review of Systems  Constitutional:  Negative for fever and unexpected weight change.  HENT: Negative for hearing loss.   Eyes: Negative for visual disturbance.  Respiratory: Negative for shortness of breath.   Cardiovascular: Negative for chest pain.  Gastrointestinal: Negative for anal bleeding.  Endocrine: Negative for cold intolerance.  Genitourinary: Negative for hematuria and vaginal bleeding.  Musculoskeletal: Negative for back pain.  Skin: Negative for rash.  Allergic/Immunologic: Negative for environmental allergies.  Neurological: Negative for numbness.  Hematological: Does not bruise/bleed easily.  Psychiatric/Behavioral: Negative for dysphoric mood.       Objective:   Physical Exam VS: see vs page GEN: no distress HEAD: head: no deformity eyes: no periorbital swelling, no proptosis external nose and ears are normal mouth: no lesion seen NECK: supple, thyroid is not enlarged CHEST WALL: no deformity LUNGS:  Clear to auscultation BREASTS:  No mass.  No d/c CV: reg rate and rhythm, no murmur ABD: abdomen is soft, nontender.  no hepatosplenomegaly.  not distended.  no hernia GENITALIA:  Normal external female.  Normal bimanual exam RECTAL: normal external and internal exam.  heme neg MUSCULOSKELETAL: muscle bulk and strength are grossly normal.  no obvious joint swelling.  gait is normal and steady EXTEMITIES: no deformity.  no ulcer on the feet.  feet are of normal color and temp.  no edema PULSES: dorsalis pedis intact bilat.  no carotid bruit NEURO:  cn 2-12 grossly intact.   readily moves all 4's.  sensation is intact to touch on the feet SKIN:  Normal texture and temperature.  No rash or suspicious lesion is visible.   NODES:  None palpable at the neck PSYCH: alert, oriented x3.  Does not appear anxious nor depressed.    i reviewed electrocardiogram: no change    Assessment & Plan:  Wellness visit today, with problems stable, except as noted.  we discussed code status.  pt requests full code,  but would not want to be started or maintained on artificial life-support measures if there was not a reasonable chance of recovery

## 2012-10-25 ENCOUNTER — Ambulatory Visit: Admission: RE | Admit: 2012-10-25 | Payer: Medicare Other | Source: Ambulatory Visit

## 2012-10-25 LAB — PTH, INTACT AND CALCIUM: Calcium: 10.2 mg/dL (ref 8.4–10.5)

## 2012-12-08 ENCOUNTER — Encounter: Payer: Self-pay | Admitting: Gastroenterology

## 2012-12-19 ENCOUNTER — Other Ambulatory Visit: Payer: Self-pay

## 2012-12-19 DIAGNOSIS — Z1231 Encounter for screening mammogram for malignant neoplasm of breast: Secondary | ICD-10-CM

## 2012-12-30 DIAGNOSIS — Z23 Encounter for immunization: Secondary | ICD-10-CM | POA: Diagnosis not present

## 2013-01-20 ENCOUNTER — Ambulatory Visit
Admission: RE | Admit: 2013-01-20 | Discharge: 2013-01-20 | Disposition: A | Discharge status: Home / Self Care | Payer: Medicare Other | Source: Ambulatory Visit

## 2013-01-20 DIAGNOSIS — Z1231 Encounter for screening mammogram for malignant neoplasm of breast: Secondary | ICD-10-CM

## 2013-03-27 DIAGNOSIS — Z961 Presence of intraocular lens: Secondary | ICD-10-CM | POA: Diagnosis not present

## 2013-03-27 DIAGNOSIS — H35379 Puckering of macula, unspecified eye: Secondary | ICD-10-CM | POA: Diagnosis not present

## 2013-06-09 DIAGNOSIS — N3020 Other chronic cystitis without hematuria: Secondary | ICD-10-CM | POA: Diagnosis not present

## 2013-06-09 DIAGNOSIS — R351 Nocturia: Secondary | ICD-10-CM | POA: Diagnosis not present

## 2013-07-03 ENCOUNTER — Other Ambulatory Visit: Payer: Self-pay

## 2013-07-03 ENCOUNTER — Ambulatory Visit (INDEPENDENT_AMBULATORY_CARE_PROVIDER_SITE_OTHER): Payer: Medicare Other | Admitting: Endocrinology

## 2013-07-03 ENCOUNTER — Encounter: Payer: Self-pay | Admitting: Endocrinology

## 2013-07-03 VITALS — BP 122/80 | HR 79 | Temp 98.0°F | Ht 61.0 in | Wt 140.0 lb

## 2013-07-03 DIAGNOSIS — L299 Pruritus, unspecified: Secondary | ICD-10-CM

## 2013-07-03 DIAGNOSIS — L509 Urticaria, unspecified: Secondary | ICD-10-CM

## 2013-07-03 DIAGNOSIS — L500 Allergic urticaria: Secondary | ICD-10-CM

## 2013-07-03 MED ORDER — TRIAMCINOLONE ACETONIDE 0.1 % EX CREA
1.0000 | TOPICAL_CREAM | Freq: Four times a day (QID) | CUTANEOUS | Status: DC
Start: 2013-07-03 — End: 2013-11-24

## 2013-07-03 MED ORDER — METHYLPREDNISOLONE (PAK) 4 MG PO TABS: ORAL_TABLET | ORAL | Status: DC

## 2013-07-03 MED ORDER — TRIAMCINOLONE ACETONIDE 0.1 % EX CREA: 1.0000 "application " | TOPICAL_CREAM | Freq: Four times a day (QID) | CUTANEOUS | Status: DC

## 2013-07-03 MED ORDER — FUROSEMIDE 20 MG PO TABS: 20.0000 mg | ORAL_TABLET | Freq: Every day | ORAL | Status: DC

## 2013-07-03 MED ORDER — HYDROXYZINE HCL 10 MG PO TABS: 10.0000 mg | ORAL_TABLET | ORAL | Status: DC | PRN

## 2013-07-03 NOTE — Progress Notes (Signed)
   Subjective:    Patient ID: Tanya Espinoza, female    DOB: 1940/04/08, 73 y.o.   MRN: [id number]  HPI Pt states 2 days of moderate itching throughout the body, and assoc rash.  She is unable to cite precip factor, but she had similar sxs 1 year ago.   Past Medical History  Diagnosis Date  . HYPERLIPIDEMIA 11/30/2006  . DEPRESSION 11/30/2006  . HYPERTENSION 11/30/2006  . BRADYCARDIA 08/23/2007  . PSORIASIS 11/30/2006  . OSTEOPOROSIS 11/30/2006  . ASYMPTOMATIC POSTMENOPAUSAL STATUS 08/23/2007  . UTI'S, HX OF 11/30/2006    Past Surgical History  Procedure Laterality Date  . Eye surgery      History   Social History  . Marital Status: Married    Spouse Name: N/A    Number of Children: N/A  . Years of Education: N/A   Occupational History  . Insurance    Social History Main Topics  . Smoking status: Never Smoker   . Smokeless tobacco: Never Used  . Alcohol Use: No  . Drug Use: No  . Sexual Activity: Yes    Birth Control/ Protection: None   Other Topics Concern  . Not on file   Social History Narrative  . No narrative on file    Current Outpatient Prescriptions on File Prior to Visit  Medication Sig Dispense Refill  . aspirin 81 MG tablet Take 81 mg by mouth daily.        Marland Kitchen. EPINEPHrine (EPIPEN) 0.3 mg/0.3 mL DEVI Inject 0.3 mLs (0.3 mg total) into the muscle once.  2 Device  1  . pimecrolimus (ELIDEL) 1 % cream Apply topically daily as needed.       . rosuvastatin (CRESTOR) 40 MG tablet Take 1 tablet (40 mg total) by mouth daily.  90 tablet  3  . oxybutynin (DITROPAN) 5 MG tablet Take 5 mg by mouth daily.       No current facility-administered medications on file prior to visit.    No Known Allergies  Family History  Problem Relation Age of Onset  . Cancer Sister     Breast Cancer    BP 122/80  Pulse 79  Temp(Src) 98 F (36.7 C) (Oral)  Ht 5\' 1"  (1.549 m)  Wt 140 lb (63.504 kg)  BMI 26.47 kg/m2  SpO2 97%  Review of Systems Denies fever and sob.      Objective:   Physical Exam VITAL SIGNS:  See vs page GENERAL: no distress Skin: mild diffuse urticaria. Ext: no edema      Assessment & Plan:  Urticaria, recurrent Edema: mild,

## 2013-07-03 NOTE — Patient Instructions (Addendum)
i have sent 3 prescriptions to your pharmacy: for a steroid "pack," a refill of your hydroxyzine pills, and skin cream.  I hope you feel better soon.  If you don't feel better by next week, please call back.  Please call sooner if you get worse.  Also, i have sent a prescription to your pharmacy, to change the HCTZ/triamterene to a stronger fluid pill.      Hives Hives are itchy, red, swollen areas of the skin. They can vary in size and location on your body. Hives can come and go for hours or several days (acute hives) or for several weeks (chronic hives). Hives do not spread from person to person (noncontagious). They may get worse with scratching, exercise, and emotional stress. CAUSES   Allergic reaction to food, additives, or drugs.  Infections, including the common cold.  Illness, such as vasculitis, lupus, or thyroid disease.  Exposure to sunlight, heat, or cold.  Exercise.  Stress.  Contact with chemicals. SYMPTOMS   Red or white swollen patches on the skin. The patches may change size, shape, and location quickly and repeatedly.  Itching.  Swelling of the hands, feet, and face. This may occur if hives develop deeper in the skin. DIAGNOSIS  Your caregiver can usually tell what is wrong by performing a physical exam. Skin or blood tests may also be done to determine the cause of your hives. In some cases, the cause cannot be determined. TREATMENT  Mild cases usually get better with medicines such as antihistamines. Severe cases may require an emergency epinephrine injection. If the cause of your hives is known, treatment includes avoiding that trigger.  HOME CARE INSTRUCTIONS   Avoid causes that trigger your hives.  Take antihistamines as directed by your caregiver to reduce the severity of your hives. Non-sedating or low-sedating antihistamines are usually recommended. Do not drive while taking an antihistamine.  Take any other medicines prescribed for itching as  directed by your caregiver.  Wear loose-fitting clothing.  Keep all follow-up appointments as directed by your caregiver. SEEK MEDICAL CARE IF:   You have persistent or severe itching that is not relieved with medicine.  You have painful or swollen joints. SEEK IMMEDIATE MEDICAL CARE IF:   You have a fever.  Your tongue or lips are swollen.  You have trouble breathing or swallowing.  You feel tightness in the throat or chest.  You have abdominal pain. These problems may be the first sign of a life-threatening allergic reaction. Call your local emergency services (911 in [place] MAKE SURE YOU:   Understand these instructions.  Will watch your condition.  Will get help right away if you are not doing well or get worse. Document Released: 03/02/2005 Document Revised: 09/01/2011 Document Reviewed: 05/26/2011 [facility] Patient Information 2014 [employer], [place].

## 2013-07-06 ENCOUNTER — Telehealth: Payer: Self-pay | Admitting: Endocrinology

## 2013-07-06 NOTE — Telephone Encounter (Signed)
Patient states that she was seen last Monday with Hives. She says that the hives have not cleared up at all and she will run out of medicine tomorrow. She would like to know what else she should do?

## 2013-07-06 NOTE — Telephone Encounter (Signed)
Options:  i can renew the pills i prescribed for you. You could go to [facility], for a steroid injection. Please let me know

## 2013-07-06 NOTE — Telephone Encounter (Signed)
Pt would like something to help her she does not care what is done. She would like a call back but would like the injection so please set that up asap she states

## 2013-07-06 NOTE — Telephone Encounter (Signed)
Requested call back.  

## 2013-07-06 NOTE — Telephone Encounter (Signed)
Please call [facility].  Tell them pt will go there for injection.  i have placed order.

## 2013-07-06 NOTE — Telephone Encounter (Signed)
Please see below.

## 2013-07-06 NOTE — Telephone Encounter (Signed)
See below and please advise, Thanks!  

## 2013-07-07 ENCOUNTER — Ambulatory Visit (INDEPENDENT_AMBULATORY_CARE_PROVIDER_SITE_OTHER): Payer: Medicare Other | Admitting: Internal Medicine

## 2013-07-07 ENCOUNTER — Ambulatory Visit: Payer: Medicare Other

## 2013-07-07 ENCOUNTER — Encounter: Payer: Self-pay | Admitting: Internal Medicine

## 2013-07-07 VITALS — BP 130/82 | HR 81 | Temp 98.0°F | Wt 138.4 lb

## 2013-07-07 DIAGNOSIS — L500 Allergic urticaria: Secondary | ICD-10-CM | POA: Diagnosis not present

## 2013-07-07 MED ORDER — RANITIDINE HCL 150 MG PO TABS: 150.0000 mg | ORAL_TABLET | Freq: Two times a day (BID) | ORAL | Status: DC

## 2013-07-07 MED ORDER — PREDNISONE 20 MG PO TABS: 20.0000 mg | ORAL_TABLET | Freq: Two times a day (BID) | ORAL | Status: DC

## 2013-07-07 NOTE — Patient Instructions (Signed)
Avoid perfumes and cosmetics which are not hypoallergenic. Restrict hyperallergenic foods at this time: Nuts, strawberries, seafood , chocolate, and tomatoes. Take the ranitidine 30 minutes before breakfast and evening meal. This blocks a histamine pathway as we discussed 

## 2013-07-07 NOTE — Progress Notes (Signed)
   Subjective:    Patient ID: Tanya Espinoza, female    DOB: 06-15-1940, 73 y.o.   MRN: [id number]  HPI She was seen on 07/03/13 for diffuse urticaria with hives and was prescribed prednisone and Hydroxyzine.  Symptoms have not resolved since that time and more hives continue diffusely on face, neck, arms, chest, legs.  She cannot associate any trigger or causative agent. The same symptoms happened last year at this time. She was evaluated by an Allergist @ that time; no etiology was determined. Apparently all skin tests were negative  She specifically denies angioedema, nasal congestion, discolored nasal secretions.   She began Clindamycin approximately 2-3 weeks ago her Periodontist , Dr. Burgess Estelleanner. No other new meds.   Review of Systems She denies fever, chills, sweat, nausea, vomiting, sneezing, itchy watery eyes, cough, sputum production, wheezing, dyspnea.  Diarrhea, vomiting, bleeding, bruising, enlarged lymph nodes, joint pain or swelling are also denied.      Objective:   Physical Exam General appearance:Thinbut in good health, thin. No acute distress or increased work of breathing is present.  No lymphadenopathy about the head, neck, or axilla noted.  Eyes: No conjunctival inflammation or lid edema is present. There is no scleral icterus.  Ears: External ear exam shows no significant lesions or deformities. Otoscopic examination reveals clear canals, tympanic membranes are intact bilaterally without bulging, retraction, inflammation or discharge.  Nose: External nasal examination shows no deformity or inflammation. 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 No septal dislocation or deviation.  Oral exam: Dental hygiene is good; lips and gums are healthy appearing.There is no oropharyngeal erythema, exudate, or crowding noted.  Neck: No deformities, masses, or tenderness noted. Supple with full range of motion without pain. No upper airway compromise with  hyperventilation.  Heart: Normal rate and regular rhythm. S1 and S2 normal without gallop, murmur, click, rub or other extra sounds.  Lungs:Chest clear to auscultation; no wheezes, rhonchi,rales ,or rubs present.No increased work of breathing.  Extremities: No cyanosis, edema, or clubbing noted.  Skin: Warm & dry w/o jaundice or tenting.Diffuse linear erythema with urticaria on face, chest and extremities. + Dermatographia test.       Assessment & Plan:  #1 hives, urticaria - add 2nd gen antihistamine;potential  Triggers discussed

## 2013-07-07 NOTE — Progress Notes (Signed)
   Subjective:    Patient ID: Tanya Espinoza, female    DOB: 23-Dec-1940, 73 y.o.   MRN: [id number]  HPI  She was seen on 07/03/13 for diffuse urticaria with hives and was prescribed prednisone and Hydroxyzine.  Symptoms have not resolved since that time and more hives continue diffusely on face, neck, arms, chest, legs.  She cannot associate any trigger or causative agent. The same symptoms happened last year at this time.   She began Clindamycin approximately 2-3 weeks ago after periodontist visit with Dr. Burgess Estelleanner. No other new meds.    Review of Systems She denies fever, chills, sweat, nausea, vomiting, sneezing, itchy watery eyes, cough, sputum production, wheezing, dyspnea.  Diarrhea, vomiting, bleeding, bruising, enlarged lymph nodes, joint pain or swelling are also denied.     Objective:   Physical Exam General appearance:good health, thin. No acute distress or increased work of breathing is present.    No  lymphadenopathy about the head, neck, or axilla noted.   Eyes: No conjunctival inflammation or lid edema is present. There is no scleral icterus.  Ears:  External ear exam shows no significant lesions or deformities.  Otoscopic examination reveals clear canals, tympanic membranes are intact bilaterally without bulging, retraction, inflammation or discharge.  Nose:  External nasal examination shows no deformity or inflammation. 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 No septal dislocation or deviation.  Oral exam: Dental hygiene is good; lips and gums are healthy appearing.There is no oropharyngeal erythema or exudate noted.   Neck:  No deformities, masses, or tenderness noted.  Supple with full range of motion without pain. CTA with hyperventilation.   Heart:  Normal rate and regular rhythm. S1 and S2 normal without gallop, murmur, click, rub or other extra sounds.   Lungs:Chest clear to auscultation; no wheezes, rhonchi,rales ,or rubs present.No  increased work of breathing.   Extremities:  No cyanosis, edema, or clubbing noted. Diffuse linear erythema with urticaria on face, chest and extremities. + Dermatographia test.   Skin: Warm & dry w/o jaundice or tenting.     Assessment & Plan:  #1 hives, urticaria - add 2nd gen antihistimine; refer to allergist, avoid triggers.

## 2013-07-07 NOTE — Telephone Encounter (Signed)
Pt scheduled for appointment on 07/07/2013 and informed to go anytime before 12.

## 2013-07-07 NOTE — Progress Notes (Signed)
Pre visit review using our clinic review tool, if applicable. No additional management support is needed unless otherwise documented below in the visit note. 

## 2013-07-20 DIAGNOSIS — J309 Allergic rhinitis, unspecified: Secondary | ICD-10-CM | POA: Diagnosis not present

## 2013-07-20 DIAGNOSIS — L509 Urticaria, unspecified: Secondary | ICD-10-CM | POA: Diagnosis not present

## 2013-07-20 DIAGNOSIS — H1045 Other chronic allergic conjunctivitis: Secondary | ICD-10-CM | POA: Diagnosis not present

## 2013-10-11 ENCOUNTER — Other Ambulatory Visit: Payer: Self-pay | Admitting: Endocrinology

## 2013-10-27 ENCOUNTER — Other Ambulatory Visit: Payer: Medicare Other

## 2013-10-31 ENCOUNTER — Other Ambulatory Visit: Payer: Self-pay | Admitting: Endocrinology

## 2013-11-02 ENCOUNTER — Encounter: Payer: Medicare Other | Admitting: Endocrinology

## 2013-11-10 ENCOUNTER — Encounter: Payer: Medicare Other | Admitting: Endocrinology

## 2013-11-24 ENCOUNTER — Ambulatory Visit (INDEPENDENT_AMBULATORY_CARE_PROVIDER_SITE_OTHER): Payer: Medicare Other | Admitting: Endocrinology

## 2013-11-24 ENCOUNTER — Encounter: Payer: Self-pay | Admitting: Endocrinology

## 2013-11-24 VITALS — BP 116/70 | HR 70 | Temp 98.7°F | Ht 61.0 in | Wt 147.0 lb

## 2013-11-24 DIAGNOSIS — Z87448 Personal history of other diseases of urinary system: Secondary | ICD-10-CM | POA: Diagnosis not present

## 2013-11-24 DIAGNOSIS — Z23 Encounter for immunization: Secondary | ICD-10-CM | POA: Diagnosis not present

## 2013-11-24 DIAGNOSIS — E785 Hyperlipidemia, unspecified: Secondary | ICD-10-CM | POA: Diagnosis not present

## 2013-11-24 DIAGNOSIS — Z0000 Encounter for general adult medical examination without abnormal findings: Secondary | ICD-10-CM | POA: Diagnosis not present

## 2013-11-24 DIAGNOSIS — Z79899 Other long term (current) drug therapy: Secondary | ICD-10-CM

## 2013-11-24 DIAGNOSIS — I10 Essential (primary) hypertension: Secondary | ICD-10-CM

## 2013-11-24 DIAGNOSIS — K769 Liver disease, unspecified: Secondary | ICD-10-CM | POA: Diagnosis not present

## 2013-11-24 DIAGNOSIS — D649 Anemia, unspecified: Secondary | ICD-10-CM | POA: Diagnosis not present

## 2013-11-24 DIAGNOSIS — M810 Age-related osteoporosis without current pathological fracture: Secondary | ICD-10-CM

## 2013-11-24 LAB — HEPATIC FUNCTION PANEL
ALK PHOS: 57 U/L (ref 39–117)
ALT: 32 U/L (ref 0–35)
AST: 36 U/L (ref 0–37)
Albumin: 4.1 g/dL (ref 3.5–5.2)
BILIRUBIN DIRECT: 0.1 mg/dL (ref 0.0–0.3)
Total Bilirubin: 0.5 mg/dL (ref 0.2–1.2)
Total Protein: 6.7 g/dL (ref 6.0–8.3)

## 2013-11-24 LAB — CBC WITH DIFFERENTIAL/PLATELET
BASOS ABS: 0 10*3/uL (ref 0.0–0.1)
Basophils Relative: 0.7 % (ref 0.0–3.0)
Eosinophils Absolute: 0.1 10*3/uL (ref 0.0–0.7)
Eosinophils Relative: 1.5 % (ref 0.0–5.0)
HEMATOCRIT: 35.8 % — AB (ref 36.0–46.0)
Hemoglobin: 12.2 g/dL (ref 12.0–15.0)
LYMPHS ABS: 1.9 10*3/uL (ref 0.7–4.0)
Lymphocytes Relative: 32.5 % (ref 12.0–46.0)
MCHC: 34.2 g/dL (ref 30.0–36.0)
MCV: 88.3 fl (ref 78.0–100.0)
MONO ABS: 0.4 10*3/uL (ref 0.1–1.0)
Monocytes Relative: 7.4 % (ref 3.0–12.0)
NEUTROS ABS: 3.3 10*3/uL (ref 1.4–7.7)
Neutrophils Relative %: 57.9 % (ref 43.0–77.0)
Platelets: 182 10*3/uL (ref 150.0–400.0)
RBC: 4.06 Mil/uL (ref 3.87–5.11)
RDW: 14.8 % (ref 11.5–15.5)
WBC: 5.8 10*3/uL (ref 4.0–10.5)

## 2013-11-24 LAB — LIPID PANEL
CHOL/HDL RATIO: 2
Cholesterol: 144 mg/dL (ref 0–200)
HDL: 70.5 mg/dL (ref >39.00)
LDL CALC: 65 mg/dL (ref 0–99)
NonHDL: 73.5
Triglycerides: 43 mg/dL (ref 0.0–149.0)
VLDL: 8.6 mg/dL (ref 0.0–40.0)

## 2013-11-24 LAB — BASIC METABOLIC PANEL
BUN: 18 mg/dL (ref 6–23)
CALCIUM: 9.9 mg/dL (ref 8.4–10.5)
CO2: 27 mEq/L (ref 19–32)
Chloride: 107 mEq/L (ref 96–112)
Creatinine, Ser: 1 mg/dL (ref 0.4–1.2)
GFR: 73.65 mL/min (ref >60.00)
Glucose, Bld: 102 mg/dL — ABNORMAL HIGH (ref 70–99)
POTASSIUM: 4.3 meq/L (ref 3.5–5.1)
SODIUM: 139 meq/L (ref 135–145)

## 2013-11-24 LAB — IBC PANEL
IRON: 74 ug/dL (ref 42–145)
Saturation Ratios: 19.2 % — ABNORMAL LOW (ref 20.0–50.0)
Transferrin: 275.7 mg/dL (ref 212.0–360.0)

## 2013-11-24 LAB — TSH: TSH: 0.19 u[IU]/mL — AB (ref 0.35–4.50)

## 2013-11-24 NOTE — Patient Instructions (Signed)
please consider these measures for your health:  minimize alcohol.  do not use tobacco products.  have a colonoscopy at least every 10 years from age 71.  Women should have an annual mammogram from age 2.  keep firearms safely stored.  always use seat belts.  have working smoke alarms in your home.  see an eye doctor and dentist regularly.  never drive under the influence of alcohol or drugs (including prescription drugs).   it is critically important to prevent falling down (keep floor areas well-lit, dry, and free of loose objects.  If you have a cane, walker, or wheelchair, you should use it, even for short trips around the house.  Also, try not to rush) blood tests are being requested for you today.  We'll contact you with results.

## 2013-11-24 NOTE — Progress Notes (Signed)
Subjective:    Patient ID: Tanya Espinoza, female    DOB: 1940-05-02, 73 y.o.   MRN: [id number]  HPI The state of at least three ongoing medical problems is addressed today, with interval history of each noted here: HTN: she has not recently needed any medication.  Denies edema.  Dyslipidemia: denies chest pain.  Psoriasis: pt says this is much better recently.   Past Medical History  Diagnosis Date  . HYPERLIPIDEMIA 11/30/2006  . DEPRESSION 11/30/2006  . HYPERTENSION 11/30/2006  . BRADYCARDIA 08/23/2007  . PSORIASIS 11/30/2006  . OSTEOPOROSIS 11/30/2006  . ASYMPTOMATIC POSTMENOPAUSAL STATUS 08/23/2007  . UTI'S, HX OF 11/30/2006    Past Surgical History  Procedure Laterality Date  . Eye surgery      History   Social History  . Marital Status: Married    Spouse Name: N/A    Number of Children: N/A  . Years of Education: N/A   Occupational History  . Insurance    Social History Main Topics  . Smoking status: Never Smoker   . Smokeless tobacco: Never Used  . Alcohol Use: No  . Drug Use: No  . Sexual Activity: Yes    Birth Control/ Protection: None   Other Topics Concern  . Not on file   Social History Narrative  . No narrative on file    Current Outpatient Prescriptions on File Prior to Visit  Medication Sig Dispense Refill  . aspirin 81 MG tablet Take 81 mg by mouth daily.        . CRESTOR 40 MG tablet TAKE 1 TABLET DAILY  90 tablet  2  . furosemide (LASIX) 20 MG tablet TAKE 1 TABLET BY MOUTH EVERY DAY  30 tablet  3  . EPINEPHrine (EPIPEN) 0.3 mg/0.3 mL DEVI Inject 0.3 mLs (0.3 mg total) into the muscle once.  2 Device  1  . oxybutynin (DITROPAN) 5 MG tablet Take 5 mg by mouth daily.      . pimecrolimus (ELIDEL) 1 % cream Apply topically daily as needed.       . trimethoprim (TRIMPEX) 100 MG tablet Take 100 mg by mouth 2 (two) times daily.       No current facility-administered medications on file prior to visit.    No Known Allergies  Family History    Problem Relation Age of Onset  . Cancer Sister     Breast Cancer    BP 116/70  Pulse 70  Temp(Src) 98.7 F (37.1 C) (Oral)  Ht  (1.549 m)  Wt 147 lb (66.679 kg)  BMI 27.79 kg/m2  SpO2 99%   Review of Systems Denies sob and weight change.      Objective:   Physical Exam VITAL SIGNS:  See vs page GENERAL: no distress NECK: There is no palpable thyroid enlargement.  No thyroid nodule is palpable.  No palpable lymphadenopathy at the anterior neck. LUNGS:  Clear to auscultation.   HEART:  Regular rate and rhythm without murmurs noted. Normal S1,S2.   Skin: no rash is seen. Ext: no edema.    i reviewed electrocardiogram.  Lab Results  Component Value Date   WBC 5.8 11/24/2013   HGB 12.2 11/24/2013   HCT 35.8* 11/24/2013   PLT 182.0 11/24/2013   GLUCOSE 102* 11/24/2013   CHOL 144 11/24/2013   TRIG 43.0 11/24/2013   HDL 70.50 11/24/2013   LDLCALC 65 11/24/2013   ALT 32 11/24/2013   AST 36 11/24/2013   NA 139 11/24/2013  K 4.3 11/24/2013   CL 107 11/24/2013   CREATININE 1.0 11/24/2013   BUN 18 11/24/2013   CO2 27 11/24/2013   TSH 0.19* 11/24/2013        Assessment & Plan:  Abnormal TSH, new Dyslipidemia: well-controlled HTN: well-controlled Psoriasis: well-controlled   Subjective:   Patient here for Medicare annual wellness visit and management of other chronic and acute problems.     Risk factors: advanced age    Roster of Physicians Providing Medical Care to Patient:  See "snapshot"   Activities of Daily Living: In your present state of health, do you have any difficulty performing the following activities?:  Preparing food and eating?: No  Bathing yourself: No  Getting dressed: No  Using the toilet:No  Moving around from place to place: No  In the past year have you fallen or had a near fall?:No    Home Safety: Has smoke detector and wears seat belts. firearms are safely secured.   Diet and Exercise  Current exercise habits: pt says good Dietary issues  discussed: pt reports a healthy diet   Depression Screen  Q1: Over the past two weeks, have you felt down, depressed or hopeless?no  Q2: Over the past two weeks, have you felt little interest or pleasure in doing things? no   The following portions of the patient's history were reviewed and updated as appropriate: allergies, current medications, past family history, past medical history, past social history, past surgical history and problem list.  Past Medical History  Diagnosis Date  . HYPERLIPIDEMIA 11/30/2006  . DEPRESSION 11/30/2006  . HYPERTENSION 11/30/2006  . BRADYCARDIA 08/23/2007  . PSORIASIS 11/30/2006  . OSTEOPOROSIS 11/30/2006  . ASYMPTOMATIC POSTMENOPAUSAL STATUS 08/23/2007  . UTI'S, HX OF 11/30/2006    Past Surgical History  Procedure Laterality Date  . Eye surgery      History   Social History  . Marital Status: Married    Spouse Name: N/A    Number of Children: N/A  . Years of Education: N/A   Occupational History  . Insurance    Social History Main Topics  . Smoking status: Never Smoker   . Smokeless tobacco: Never Used  . Alcohol Use: No  . Drug Use: No  . Sexual Activity: Yes    Birth Control/ Protection: None   Other Topics Concern  . Not on file   Social History Narrative  . No narrative on file    Current Outpatient Prescriptions on File Prior to Visit  Medication Sig Dispense Refill  . aspirin 81 MG tablet Take 81 mg by mouth daily.        . CRESTOR 40 MG tablet TAKE 1 TABLET DAILY  90 tablet  2  . furosemide (LASIX) 20 MG tablet TAKE 1 TABLET BY MOUTH EVERY DAY  30 tablet  3  . EPINEPHrine (EPIPEN) 0.3 mg/0.3 mL DEVI Inject 0.3 mLs (0.3 mg total) into the muscle once.  2 Device  1  . oxybutynin (DITROPAN) 5 MG tablet Take 5 mg by mouth daily.      . pimecrolimus (ELIDEL) 1 % cream Apply topically daily as needed.       . trimethoprim (TRIMPEX) 100 MG tablet Take 100 mg by mouth 2 (two) times daily.       No current facility-administered  medications on file prior to visit.    No Known Allergies  Family History  Problem Relation Age of Onset  . Cancer Sister     Breast Cancer  BP 116/70  Pulse 70  Temp(Src) 98.7 F (37.1 C) (Oral)  Ht  (1.549 m)  Wt 147 lb (66.679 kg)  BMI 27.79 kg/m2  SpO2 99%  Review of Systems  Denies hearing loss, and visual loss Objective:   Vision:  Sees opthalmologist Hearing: grossly normal Body mass index:  See vs page Msk: pt easily and quickly performs "get-up-and-go" from a sitting position Cognitive Impairment Assessment: cognition, memory and judgment appear normal.  remembers 3/3 at 5 minutes.  excellent recall.  can easily read and write a sentence.  alert and oriented x 3   Assessment:   Medicare wellness utd on preventive parameters    Plan:   During the course of the visit the patient was educated and counseled about appropriate screening and preventive services including:        Fall prevention   Screening mammography  Bone densitometry screening  Diabetes screening  Nutrition counseling   Vaccines / LABS Zostavax / Pneumococcal Vaccine  Today.  Patient Instructions (the written plan) was given to the patient.   we discussed code status.  pt requests full code, but would not want to be started or maintained on artificial life-support measures if there was not a reasonable chance of recovery

## 2013-11-27 LAB — PTH, INTACT AND CALCIUM
Calcium: 10 mg/dL (ref 8.4–10.5)
PTH: 32 pg/mL (ref 14–64)

## 2013-11-27 LAB — URINALYSIS, ROUTINE W REFLEX MICROSCOPIC
Bilirubin Urine: NEGATIVE
Ketones, ur: NEGATIVE
Nitrite: POSITIVE — AB
SPECIFIC GRAVITY, URINE: 1.015 (ref 1.000–1.030)
Total Protein, Urine: NEGATIVE
UROBILINOGEN UA: 0.2 (ref 0.0–1.0)
Urine Glucose: NEGATIVE
pH: 6 (ref 5.0–8.0)

## 2013-11-28 ENCOUNTER — Telehealth: Payer: Self-pay | Admitting: Endocrinology

## 2013-11-28 NOTE — Telephone Encounter (Signed)
Lvom advising of recent lab work Requested call back if pt would like to discuss.

## 2013-11-28 NOTE — Telephone Encounter (Signed)
Patient ask you to call her and explain her lab results to her again.

## 2013-11-30 ENCOUNTER — Encounter: Payer: Self-pay | Admitting: Internal Medicine

## 2013-12-25 ENCOUNTER — Other Ambulatory Visit: Payer: Self-pay

## 2013-12-25 ENCOUNTER — Other Ambulatory Visit: Payer: Medicare Other

## 2013-12-25 ENCOUNTER — Other Ambulatory Visit: Payer: Self-pay | Admitting: Endocrinology

## 2013-12-25 DIAGNOSIS — R946 Abnormal results of thyroid function studies: Secondary | ICD-10-CM | POA: Diagnosis not present

## 2013-12-25 DIAGNOSIS — R7989 Other specified abnormal findings of blood chemistry: Secondary | ICD-10-CM | POA: Insufficient documentation

## 2013-12-25 LAB — TSH: TSH: 0.346 u[IU]/mL — ABNORMAL LOW (ref 0.350–4.500)

## 2013-12-26 ENCOUNTER — Other Ambulatory Visit: Payer: Self-pay

## 2013-12-26 DIAGNOSIS — Z1231 Encounter for screening mammogram for malignant neoplasm of breast: Secondary | ICD-10-CM

## 2014-01-01 ENCOUNTER — Ambulatory Visit (AMBULATORY_SURGERY_CENTER): Payer: Self-pay | Admitting: *Deleted

## 2014-01-01 VITALS — Ht 62.0 in | Wt 148.0 lb

## 2014-01-01 DIAGNOSIS — Z1211 Encounter for screening for malignant neoplasm of colon: Secondary | ICD-10-CM

## 2014-01-01 MED ORDER — MOVIPREP 100 G PO SOLR: ORAL | Status: DC

## 2014-01-01 NOTE — Progress Notes (Signed)
Patient denies any allergies to eggs or soy. Patient denies any problems with anesthesia/sedation. Patient denies any oxygen use at home and does not take any diet/weight loss medications. EMMI education assisgned to patient on colonoscopy, this was explained and instructions given to patient. 

## 2014-01-15 NOTE — Telephone Encounter (Signed)
Error.  There was no phone call to this pt.  Open call in error. maw

## 2014-01-16 ENCOUNTER — Telehealth: Payer: Self-pay | Admitting: Internal Medicine

## 2014-01-16 ENCOUNTER — Encounter: Payer: Self-pay | Admitting: Internal Medicine

## 2014-01-16 ENCOUNTER — Ambulatory Visit (AMBULATORY_SURGERY_CENTER): Payer: Medicare Other | Admitting: Internal Medicine

## 2014-01-16 VITALS — BP 130/62 | HR 54 | Temp 98.2°F | Resp 19 | Ht 62.0 in | Wt 148.0 lb

## 2014-01-16 DIAGNOSIS — I10 Essential (primary) hypertension: Secondary | ICD-10-CM | POA: Diagnosis not present

## 2014-01-16 DIAGNOSIS — F329 Major depressive disorder, single episode, unspecified: Secondary | ICD-10-CM | POA: Diagnosis not present

## 2014-01-16 DIAGNOSIS — Z1211 Encounter for screening for malignant neoplasm of colon: Secondary | ICD-10-CM | POA: Diagnosis not present

## 2014-01-16 DIAGNOSIS — F959 Tic disorder, unspecified: Secondary | ICD-10-CM | POA: Diagnosis not present

## 2014-01-16 MED ORDER — SODIUM CHLORIDE 0.9 % IV SOLN: 500.0000 mL | INTRAVENOUS | Status: DC

## 2014-01-16 NOTE — Progress Notes (Signed)
Pt stable to RR 

## 2014-01-16 NOTE — Op Note (Signed)
[facility] [address] [place], [id number]   COLONOSCOPY PROCEDURE REPORT  PATIENT: Tanya Espinoza, Darneshia J  MR#: [id number] BIRTHDATE: 08-30-1940 , 73  yrs. old GENDER: female ENDOSCOPIST: Hart Carwinora M Brodie, MD REFERRED XB:JYNWBY:Sean Ardeen GarlandA Ellison, M.D. PROCEDURE DATE:  01/16/2014 PROCEDURE:   Colonoscopy, screening First Screening Colonoscopy - Avg.  risk and is 50 yrs.  old or older - No.  Prior Negative Screening - Now for repeat screening. 10 or more years since last screening  History of Adenoma - Now for follow-up colonoscopy & has been > or = to 3 yrs.  N/A  Polyps Removed Today? No.  Polyps Removed Today? No.  Recommend repeat exam, <10 yrs? Polyps Removed Today? No.  Recommend repeat exam, <10 yrs? No. ASA CLASS:   Class II INDICATIONS:average risk for colon cancer and prior colonoscopy in February 2004 was normal. MEDICATIONS: Monitored anesthesia care and Propofol 200 mg IV  DESCRIPTION OF PROCEDURE:   After the risks benefits and alternatives of the procedure were thoroughly explained, informed consent was obtained.  The digital rectal exam revealed no abnormalities of the rectum.   The LB [id number]-[id number]  endoscope was introduced through the anus and advanced to the cecum, which was identified by both the appendix and ileocecal valve. No adverse events experienced.   The quality of the prep was good, using MoviPrep  The instrument was then slowly withdrawn as the colon was fully examined.      COLON FINDINGS: There was severe diverticulosis noted in the sigmoid colon and descending colon with associated muscular hypertrophy, luminal narrowing, angulation and tortuosity.  Retroflexed views revealed no abnormalities. The time to cecum=7 minutes 55 seconds. Withdrawal time=7 minutes 470 seconds.  The scope was withdrawn and the procedure completed. COMPLICATIONS: There were no immediate complications.  ENDOSCOPIC IMPRESSION: There was severe diverticulosis  noted in the sigmoid colon and descending colon  RECOMMENDATIONS: High fiber diet Begin fiber supplements such as Benefiber 1 tablespoon daily Recall colonoscopy in 10 years if general health allows  eSigned:  Hart Carwinora M Brodie, MD 01/16/2014 3:07 PM   cc:   PATIENT NAME:  Tanya Espinoza, Weslynn J MR#: [id number]

## 2014-01-16 NOTE — Patient Instructions (Signed)
YOU HAD AN ENDOSCOPIC PROCEDURE TODAY AT THE [facility]: Refer to the procedure report that was given to you for any specific questions about what was found during the examination.  If the procedure report does not answer your questions, please call your gastroenterologist to clarify.  If you requested that your care partner not be given the details of your procedure findings, then the procedure report has been included in a sealed envelope for you to review at your convenience later.  YOU SHOULD EXPECT: Some feelings of bloating in the abdomen. Passage of more gas than usual.  Walking can help get rid of the air that was put into your GI tract during the procedure and reduce the bloating. If you had a lower endoscopy (such as a colonoscopy or flexible sigmoidoscopy) you may notice spotting of blood in your stool or on the toilet paper. If you underwent a bowel prep for your procedure, then you may not have a normal bowel movement for a few days.  DIET: Your first meal following the procedure should be a light meal and then it is ok to progress to your normal diet.  A half-sandwich or bowl of soup is an example of a good first meal.  Heavy or fried foods are harder to digest and may make you feel nauseous or bloated.  Likewise meals heavy in dairy and vegetables can cause extra gas to form and this can also increase the bloating.  Drink plenty of fluids but you should avoid alcoholic beverages for 24 hours. Please, try benefiber every day, and try to increase the fiber in your diet.  ACTIVITY: Your care partner should take you home directly after the procedure.  You should plan to take it easy, moving slowly for the rest of the day.  You can resume normal activity the day after the procedure however you should NOT DRIVE or use heavy machinery for 24 hours (because of the sedation medicines used during the test).    SYMPTOMS TO REPORT IMMEDIATELY: A gastroenterologist can be reached at any  hour.  During normal business hours, 8:30 AM to 5:00 PM Monday through Friday, call [phone].  After hours and on weekends, please call the GI answering service at [phone] who will take a message and have the physician on call contact you.   Following lower endoscopy (colonoscopy or flexible sigmoidoscopy):  Excessive amounts of blood in the stool  Significant tenderness or worsening of abdominal pains  Swelling of the abdomen that is new, acute  Fever of 100F or higher  FOLLOW UP: If any biopsies were taken you will be contacted by phone or by letter within the next 1-3 weeks.  Call your gastroenterologist if you have not heard about the biopsies in 3 weeks.  Our staff will call the home number listed on your records the next business day following your procedure to check on you and address any questions or concerns that you may have at that time regarding the information given to you following your procedure. This is a courtesy call and so if there is no answer at the home number and we have not heard from you through the emergency physician on call, we will assume that you have returned to your regular daily activities without incident.  SIGNATURES/CONFIDENTIALITY: You and/or your care partner have signed paperwork which will be entered into your electronic medical record.  These signatures attest to the fact that that the information above on your After Visit Summary  has been reviewed and is understood.  Full responsibility of the confidentiality of this discharge information lies with you and/or your care-partner. 

## 2014-01-16 NOTE — Telephone Encounter (Signed)
Pt is concerned about riding in the car and getting here on time.  She is still having to move her bowels.  Assured her that it was ok if she is a few minutes late and to let us know if she sees that she will be extremely delayed.

## 2014-01-17 ENCOUNTER — Telehealth: Payer: Self-pay

## 2014-01-17 NOTE — Telephone Encounter (Signed)
  Follow up Call-  Call back number 01/16/2014  Post procedure Call Back phone  # [phone]  Permission to leave phone message Yes     Patient questions:  Do you have a fever, pain , or abdominal swelling? No. Pain Score  0 *  Have you tolerated food without any problems? Yes.    Have you been able to return to your normal activities? Yes.    Do you have any questions about your discharge instructions: Diet   No. Medications  No. Follow up visit  No.  Do you have questions or concerns about your Care? No.  Actions: * If pain score is 4 or above: No action needed, pain <4.

## 2014-01-22 ENCOUNTER — Ambulatory Visit
Admission: RE | Admit: 2014-01-22 | Discharge: 2014-01-22 | Disposition: A | Discharge status: Home / Self Care | Payer: Medicare Other | Source: Ambulatory Visit

## 2014-01-22 ENCOUNTER — Encounter (INDEPENDENT_AMBULATORY_CARE_PROVIDER_SITE_OTHER): Payer: Self-pay

## 2014-01-22 DIAGNOSIS — Z1231 Encounter for screening mammogram for malignant neoplasm of breast: Secondary | ICD-10-CM | POA: Diagnosis not present

## 2014-03-12 ENCOUNTER — Other Ambulatory Visit: Payer: Self-pay | Admitting: Endocrinology

## 2014-03-15 DIAGNOSIS — L509 Urticaria, unspecified: Secondary | ICD-10-CM | POA: Diagnosis not present

## 2014-03-15 DIAGNOSIS — H1045 Other chronic allergic conjunctivitis: Secondary | ICD-10-CM | POA: Diagnosis not present

## 2014-04-05 DIAGNOSIS — H35373 Puckering of macula, bilateral: Secondary | ICD-10-CM | POA: Diagnosis not present

## 2014-04-05 DIAGNOSIS — Z961 Presence of intraocular lens: Secondary | ICD-10-CM | POA: Diagnosis not present

## 2014-04-05 DIAGNOSIS — H10413 Chronic giant papillary conjunctivitis, bilateral: Secondary | ICD-10-CM | POA: Diagnosis not present

## 2014-06-25 ENCOUNTER — Telehealth: Payer: Self-pay | Admitting: Endocrinology

## 2014-06-25 NOTE — Telephone Encounter (Signed)
Rec'd from [facility] forward 4 pages to Dr.Ellison

## 2014-08-13 ENCOUNTER — Other Ambulatory Visit: Payer: Self-pay | Admitting: Endocrinology

## 2014-08-15 ENCOUNTER — Other Ambulatory Visit: Payer: Self-pay | Admitting: Endocrinology

## 2014-08-20 ENCOUNTER — Telehealth: Payer: Self-pay | Admitting: Endocrinology

## 2014-08-20 ENCOUNTER — Other Ambulatory Visit: Payer: Self-pay | Admitting: Endocrinology

## 2014-08-20 MED ORDER — FUROSEMIDE 20 MG PO TABS
20.0000 mg | ORAL_TABLET | Freq: Every day | ORAL | Status: DC
Start: 2014-08-20 — End: 2014-10-11

## 2014-08-20 NOTE — Telephone Encounter (Signed)
Patient called stating that her pharmacy never received her Rx  The confirmation shows transmission failed   Rx: Lasix 20 mg  Pharmacy: [facility] [address]   Thank you

## 2014-08-20 NOTE — Telephone Encounter (Signed)
Rx sent per pt's request.  

## 2014-10-11 ENCOUNTER — Other Ambulatory Visit: Payer: Self-pay | Admitting: Endocrinology

## 2014-11-06 ENCOUNTER — Other Ambulatory Visit: Payer: Self-pay | Admitting: Endocrinology

## 2014-11-16 ENCOUNTER — Telehealth: Payer: Self-pay | Admitting: Endocrinology

## 2014-11-16 NOTE — Telephone Encounter (Signed)
No , you can still take this medication.

## 2014-11-16 NOTE — Telephone Encounter (Signed)
Please call pt she has some concerns of her medication [phone]

## 2014-11-16 NOTE — Telephone Encounter (Signed)
Pt called about her Crestor Rx. Pt stated she received the generic (rosuvastatin) in the mail and she read over the recommendations for the medication and found the med is not recommended for patients over 72 years of age. Pt wanted to know if this medication is still beneficial for her to take? Please advise, Thanks!

## 2014-11-16 NOTE — Telephone Encounter (Signed)
Left voicemail advising of note below. Requested call back if the pt would like to discuss.  

## 2014-11-27 ENCOUNTER — Encounter: Payer: Self-pay | Admitting: Endocrinology

## 2014-11-27 ENCOUNTER — Ambulatory Visit (INDEPENDENT_AMBULATORY_CARE_PROVIDER_SITE_OTHER): Payer: Medicare Other | Admitting: Endocrinology

## 2014-11-27 VITALS — BP 114/70 | HR 63 | Temp 98.2°F | Ht 61.0 in | Wt 141.0 lb

## 2014-11-27 DIAGNOSIS — E785 Hyperlipidemia, unspecified: Secondary | ICD-10-CM | POA: Diagnosis not present

## 2014-11-27 DIAGNOSIS — Z23 Encounter for immunization: Secondary | ICD-10-CM

## 2014-11-27 DIAGNOSIS — R946 Abnormal results of thyroid function studies: Secondary | ICD-10-CM

## 2014-11-27 DIAGNOSIS — R609 Edema, unspecified: Secondary | ICD-10-CM

## 2014-11-27 DIAGNOSIS — K769 Liver disease, unspecified: Secondary | ICD-10-CM

## 2014-11-27 DIAGNOSIS — R7989 Other specified abnormal findings of blood chemistry: Secondary | ICD-10-CM

## 2014-11-27 DIAGNOSIS — F329 Major depressive disorder, single episode, unspecified: Secondary | ICD-10-CM | POA: Diagnosis not present

## 2014-11-27 DIAGNOSIS — Z0000 Encounter for general adult medical examination without abnormal findings: Secondary | ICD-10-CM

## 2014-11-27 DIAGNOSIS — I10 Essential (primary) hypertension: Secondary | ICD-10-CM

## 2014-11-27 DIAGNOSIS — M810 Age-related osteoporosis without current pathological fracture: Secondary | ICD-10-CM

## 2014-11-27 LAB — CBC WITH DIFFERENTIAL/PLATELET
BASOS ABS: 0 10*3/uL (ref 0.0–0.1)
Basophils Relative: 0.4 % (ref 0.0–3.0)
EOS ABS: 0 10*3/uL (ref 0.0–0.7)
Eosinophils Relative: 0.7 % (ref 0.0–5.0)
HEMATOCRIT: 37.9 % (ref 36.0–46.0)
Hemoglobin: 12.8 g/dL (ref 12.0–15.0)
LYMPHS PCT: 20.8 % (ref 12.0–46.0)
Lymphs Abs: 1.5 10*3/uL (ref 0.7–4.0)
MCHC: 33.8 g/dL (ref 30.0–36.0)
MCV: 89.5 fl (ref 78.0–100.0)
MONOS PCT: 5 % (ref 3.0–12.0)
Monocytes Absolute: 0.4 10*3/uL (ref 0.1–1.0)
NEUTROS ABS: 5.3 10*3/uL (ref 1.4–7.7)
Neutrophils Relative %: 73.1 % (ref 43.0–77.0)
PLATELETS: 183 10*3/uL (ref 150.0–400.0)
RBC: 4.23 Mil/uL (ref 3.87–5.11)
RDW: 14.3 % (ref 11.5–15.5)
WBC: 7.2 10*3/uL (ref 4.0–10.5)

## 2014-11-27 LAB — URINALYSIS, ROUTINE W REFLEX MICROSCOPIC
BILIRUBIN URINE: NEGATIVE
KETONES UR: NEGATIVE
NITRITE: POSITIVE — AB
PH: 7.5 (ref 5.0–8.0)
Specific Gravity, Urine: 1.015 (ref 1.000–1.030)
Total Protein, Urine: NEGATIVE
URINE GLUCOSE: NEGATIVE
UROBILINOGEN UA: 0.2 (ref 0.0–1.0)

## 2014-11-27 LAB — LIPID PANEL
Cholesterol: 209 mg/dL — ABNORMAL HIGH (ref 0–200)
HDL: 79.9 mg/dL (ref >39.00)
LDL CALC: 117 mg/dL — AB (ref 0–99)
NonHDL: 129.57
Total CHOL/HDL Ratio: 3
Triglycerides: 63 mg/dL (ref 0.0–149.0)
VLDL: 12.6 mg/dL (ref 0.0–40.0)

## 2014-11-27 LAB — BASIC METABOLIC PANEL
BUN: 14 mg/dL (ref 6–23)
CO2: 29 mEq/L (ref 19–32)
Calcium: 9.8 mg/dL (ref 8.4–10.5)
Chloride: 105 mEq/L (ref 96–112)
Creatinine, Ser: 0.81 mg/dL (ref 0.40–1.20)
GFR: 89.35 mL/min (ref >60.00)
Glucose, Bld: 87 mg/dL (ref 70–99)
POTASSIUM: 3.9 meq/L (ref 3.5–5.1)
Sodium: 141 mEq/L (ref 135–145)

## 2014-11-27 LAB — HEPATIC FUNCTION PANEL
ALBUMIN: 4.3 g/dL (ref 3.5–5.2)
ALT: 17 U/L (ref 0–35)
AST: 24 U/L (ref 0–37)
Alkaline Phosphatase: 52 U/L (ref 39–117)
BILIRUBIN TOTAL: 0.5 mg/dL (ref 0.2–1.2)
Bilirubin, Direct: 0.1 mg/dL (ref 0.0–0.3)
Total Protein: 7 g/dL (ref 6.0–8.3)

## 2014-11-27 LAB — TSH: TSH: 1.45 u[IU]/mL (ref 0.35–4.50)

## 2014-11-27 MED ORDER — ESCITALOPRAM OXALATE 10 MG PO TABS: 10.0000 mg | ORAL_TABLET | Freq: Every day | ORAL | Status: DC

## 2014-11-27 MED ORDER — ROSUVASTATIN CALCIUM 5 MG PO TABS: 5.0000 mg | ORAL_TABLET | Freq: Every day | ORAL | Status: DC

## 2014-11-27 NOTE — Progress Notes (Signed)
Subjective:    Patient ID: Tanya Espinoza, female    DOB: Aug 12, 1940, 74 y.o.   MRN: [id number]  HPI The state of at least three ongoing medical problems is addressed today, with interval history of each noted here: Depression: mild, but has recurred.  Denies EtOH/drugs/domestic violence/suicide  Dyslipidemia: she denies chest pain.  She has not recently taken crestor abnormal TSH: she denies weight change Edema: she takes lasix prn.  She says it helps. Past Medical History  Diagnosis Date  . HYPERLIPIDEMIA 11/30/2006  . DEPRESSION 11/30/2006  . HYPERTENSION 11/30/2006  . BRADYCARDIA 08/23/2007  . PSORIASIS 11/30/2006  . OSTEOPOROSIS 11/30/2006  . ASYMPTOMATIC POSTMENOPAUSAL STATUS 08/23/2007  . UTI'S, HX OF 11/30/2006    Past Surgical History  Procedure Laterality Date  . Eye surgery      cataracts bil.    Social History   Social History  . Marital Status: Married    Spouse Name: N/A  . Number of Children: N/A  . Years of Education: N/A   Occupational History  . Insurance    Social History Main Topics  . Smoking status: Never Smoker   . Smokeless tobacco: Never Used  . Alcohol Use: No  . Drug Use: No  . Sexual Activity: Yes    Birth Control/ Protection: None   Other Topics Concern  . Not on file   Social History Narrative    Current Outpatient Prescriptions on File Prior to Visit  Medication Sig Dispense Refill  . Calcium Carb-Cholecalciferol (CALCIUM + D3 PO) Take 1 tablet by mouth daily.    Marland Kitchen EPINEPHrine (EPIPEN) 0.3 mg/0.3 mL DEVI Inject 0.3 mLs (0.3 mg total) into the muscle once. 2 Device 1  . furosemide (LASIX) 20 MG tablet TAKE 1 TABLET (20 MG TOTAL) BY MOUTH DAILY. 30 tablet 1  . GLUCOSAMINE HCL PO Take 2 tablets by mouth daily.    . Multiple Vitamins-Minerals (CENTRUM SILVER PO) Take 1 tablet by mouth daily.    . Omega-3 Fatty Acids (FISH OIL PO) Take 1 tablet by mouth daily.     No current facility-administered medications on file prior to visit.     No Known Allergies  Family History  Problem Relation Age of Onset  . Cancer Sister     Breast Cancer  . Colon cancer Neg Hx     BP 114/70 mmHg  Pulse 63  Temp(Src) 98.2 F (36.8 C) (Oral)  Ht 5\' 1"  (1.549 m)  Wt 141 lb (63.957 kg)  BMI 26.66 kg/m2  SpO2 97%  Review of Systems Denies insomnia and tremor    Objective:   Physical Exam VITAL SIGNS:  See vs page GENERAL: no distress NECK: There is no palpable thyroid enlargement.  No thyroid nodule is palpable.  No palpable lymphadenopathy at the anterior neck. LUNGS:  Clear to auscultation HEART:  Regular rate and rhythm without murmurs noted. Normal S1,S2.   Ext: no edema Pulses: dorsalis pedis intact bilat.  no carotid bruit   Lab Results  Component Value Date   CHOL 209* 11/27/2014   HDL 79.90 11/27/2014   LDLCALC 117* 11/27/2014   TRIG 63.0 11/27/2014   CHOLHDL 3 11/27/2014   Lab Results  Component Value Date   TSH 1.45 11/27/2014       Assessment & Plan:  Dyslipidemia: therapy limited by noncompliance.  I advised pt to resume crestor Depression: mild, recurrent:  I rx'ed lexapro Abnormal TSH: resolved, but she is at risk for recurrence, so we'll follow.  Edema: well-controlled.  same medication    Subjective:   Patient here for Medicare annual wellness visit and management of other chronic and acute problems.     Risk factors: advanced age    Roster of Physicians Providing Medical Care to Patient:  See "snapshot"   Activities of Daily Living: In your present state of health, do you have any difficulty performing the following activities?:  Preparing food and eating?: No  Bathing yourself: No  Getting dressed: No  Using the toilet:No  Moving around from place to place: No  In the past year have you fallen or had a near fall?: No    Home Safety: Has smoke detector and wears seat belts. Firearms are safely stored.  Diet and Exercise  Current exercise habits: pt says good Dietary issues  discussed: pt reports a healthy diet   Depression Screen  Q1: Over the past two weeks, have you felt down, depressed or hopeless? no  Q2: Over the past two weeks, have you felt little interest or pleasure in doing things? no   The following portions of the patient's history were reviewed and updated as appropriate: allergies, current medications, past family history, past medical history, past social history, past surgical history and problem list.   Review of Systems  Denies hearing loss, and visual loss Objective:   Vision:  Sees opthalmologist Hearing: grossly normal Body mass index:  See vs page Msk: pt easily and quickly performs "get-up-and-go" from a sitting position Cognitive Impairment Assessment: cognition, memory and judgment appear normal.  remembers 3/3 at 5 minutes.  excellent recall.  can easily read and write a sentence.  alert and oriented x 3.     Assessment:   Medicare wellness utd on preventive parameters.    Plan:   During the course of the visit the patient was educated and counseled about appropriate screening and preventive services including:        Fall prevention.     Screening mammography  Bone densitometry screening  Diabetes screening  Nutrition counseling   Vaccines / LABS Zostavax / Pneumococcal Vaccine today.    Patient Instructions (the written plan) was given to the patient.

## 2014-11-27 NOTE — Progress Notes (Signed)
we discussed code status.  pt requests full code, but would not want to be started or maintained on artificial life-support measures if there was not a reasonable chance of recovery 

## 2014-11-27 NOTE — Patient Instructions (Addendum)
please consider these measures for your health:  minimize alcohol.  do not use tobacco products.  have a colonoscopy at least every 10 years from age 72.  Women should have an annual mammogram from age 29.  keep firearms safely stored.  always use seat belts.  have working smoke alarms in your home.  see an eye doctor and dentist regularly.  never drive under the influence of alcohol or drugs (including prescription drugs).   it is critically important to prevent falling down (keep floor areas well-lit, dry, and free of loose objects.  If you have a cane, walker, or wheelchair, you should use it, even for short trips around the house.  Also, try not to rush). blood tests are requested for you today.  We'll let you know about the results. Please return in 1 year.

## 2014-11-28 ENCOUNTER — Encounter: Payer: Medicare Other | Admitting: Endocrinology

## 2014-11-28 LAB — PTH, INTACT AND CALCIUM
CALCIUM: 9.6 mg/dL (ref 8.4–10.5)
PTH: 41 pg/mL (ref 14–64)

## 2014-11-29 ENCOUNTER — Telehealth: Payer: Self-pay | Admitting: Endocrinology

## 2014-11-29 NOTE — Telephone Encounter (Signed)
Pt stated at this time she does not have any symptoms of a UTI.

## 2014-11-29 NOTE — Telephone Encounter (Signed)
Patient advised of recent lab work and voiced understanding.

## 2014-11-29 NOTE — Telephone Encounter (Signed)
Patient call ing with the result of her labs

## 2014-11-29 NOTE — Telephone Encounter (Signed)
Patient ask you to call her at this Number [phone]

## 2014-11-30 NOTE — Progress Notes (Signed)
   Subjective:    Patient ID: Tanya Espinoza, female    DOB: 14-Oct-1940, 74 y.o.   MRN: [id number]  HPI    Review of Systems     Objective:   Physical Exam   i personally reviewed electrocardiogram tracing (11/27/14): Indication: dyslipidemia Impression: NORMAL     Assessment & Plan:

## 2014-12-11 ENCOUNTER — Other Ambulatory Visit: Payer: Medicare Other

## 2014-12-11 ENCOUNTER — Other Ambulatory Visit: Payer: Self-pay | Admitting: Endocrinology

## 2014-12-11 ENCOUNTER — Ambulatory Visit (INDEPENDENT_AMBULATORY_CARE_PROVIDER_SITE_OTHER)
Admission: RE | Admit: 2014-12-11 | Discharge: 2014-12-11 | Disposition: A | Discharge status: Home / Self Care | Payer: Medicare Other | Source: Ambulatory Visit | Attending: Endocrinology | Admitting: Endocrinology

## 2014-12-11 DIAGNOSIS — M810 Age-related osteoporosis without current pathological fracture: Secondary | ICD-10-CM | POA: Diagnosis not present

## 2014-12-21 ENCOUNTER — Other Ambulatory Visit: Payer: Self-pay

## 2014-12-21 DIAGNOSIS — Z803 Family history of malignant neoplasm of breast: Secondary | ICD-10-CM

## 2014-12-21 DIAGNOSIS — Z1231 Encounter for screening mammogram for malignant neoplasm of breast: Secondary | ICD-10-CM

## 2015-01-28 ENCOUNTER — Ambulatory Visit
Admission: RE | Admit: 2015-01-28 | Discharge: 2015-01-28 | Disposition: A | Discharge status: Home / Self Care | Payer: Medicare Other | Source: Ambulatory Visit

## 2015-01-28 DIAGNOSIS — Z803 Family history of malignant neoplasm of breast: Secondary | ICD-10-CM

## 2015-01-28 DIAGNOSIS — Z1231 Encounter for screening mammogram for malignant neoplasm of breast: Secondary | ICD-10-CM

## 2015-02-01 DIAGNOSIS — L509 Urticaria, unspecified: Secondary | ICD-10-CM | POA: Diagnosis not present

## 2015-02-01 DIAGNOSIS — H1045 Other chronic allergic conjunctivitis: Secondary | ICD-10-CM | POA: Diagnosis not present

## 2015-02-08 ENCOUNTER — Other Ambulatory Visit: Payer: Self-pay | Admitting: Endocrinology

## 2015-04-14 ENCOUNTER — Other Ambulatory Visit: Payer: Self-pay | Admitting: Endocrinology

## 2015-05-15 ENCOUNTER — Other Ambulatory Visit: Payer: Self-pay

## 2015-05-15 MED ORDER — FUROSEMIDE 20 MG PO TABS: ORAL_TABLET | ORAL | Status: DC

## 2015-07-19 ENCOUNTER — Telehealth: Payer: Self-pay | Admitting: Endocrinology

## 2015-07-19 NOTE — Telephone Encounter (Signed)
This sounds more like ortho Options are ov here, or see orthopedics on your own

## 2015-07-19 NOTE — Telephone Encounter (Signed)
Pt spoke with [facility] she is experienceing pain in her left shoulder if she moves a certain way. She is asking for a referral to a neurologist

## 2015-07-19 NOTE — Telephone Encounter (Signed)
Left a vm advising of note below. Requested a call back to verify how she would like to proceed.

## 2015-07-19 NOTE — Telephone Encounter (Signed)
See note below and please advise, Thanks! 

## 2015-07-26 ENCOUNTER — Ambulatory Visit
Admission: RE | Admit: 2015-07-26 | Discharge: 2015-07-26 | Disposition: A | Discharge status: Home / Self Care | Payer: Medicare Other | Source: Ambulatory Visit | Attending: Endocrinology | Admitting: Endocrinology

## 2015-07-26 ENCOUNTER — Encounter: Payer: Self-pay | Admitting: Endocrinology

## 2015-07-26 ENCOUNTER — Ambulatory Visit (INDEPENDENT_AMBULATORY_CARE_PROVIDER_SITE_OTHER): Payer: Medicare Other | Admitting: Endocrinology

## 2015-07-26 VITALS — BP 122/80 | HR 62 | Temp 98.1°F | Ht 61.0 in | Wt 144.0 lb

## 2015-07-26 DIAGNOSIS — M79602 Pain in left arm: Secondary | ICD-10-CM

## 2015-07-26 DIAGNOSIS — Z0001 Encounter for general adult medical examination with abnormal findings: Secondary | ICD-10-CM

## 2015-07-26 NOTE — Progress Notes (Signed)
Subjective:    Patient ID: Tanya Espinoza, female    DOB: 1940/04/01, 75 y.o.   MRN: [id number]  HPI Pt is here for regular wellness examination, and is feeling pretty well in general, and says chronic med probs are stable, except as noted below Past Medical History  Diagnosis Date  . HYPERLIPIDEMIA 11/30/2006  . DEPRESSION 11/30/2006  . HYPERTENSION 11/30/2006  . BRADYCARDIA 08/23/2007  . PSORIASIS 11/30/2006  . OSTEOPOROSIS 11/30/2006  . ASYMPTOMATIC POSTMENOPAUSAL STATUS 08/23/2007  . UTI'S, HX OF 11/30/2006    Past Surgical History  Procedure Laterality Date  . Eye surgery      cataracts bil.    Social History   Social History  . Marital Status: Married    Spouse Name: N/A  . Number of Children: N/A  . Years of Education: N/A   Occupational History  . Insurance    Social History Main Topics  . Smoking status: Never Smoker   . Smokeless tobacco: Never Used  . Alcohol Use: No  . Drug Use: No  . Sexual Activity: Yes    Birth Control/ Protection: None   Other Topics Concern  . Not on file   Social History Narrative    Current Outpatient Prescriptions on File Prior to Visit  Medication Sig Dispense Refill  . Calcium Carb-Cholecalciferol (CALCIUM + D3 PO) Take 1 tablet by mouth daily.    Marland Kitchen EPINEPHrine (EPIPEN) 0.3 mg/0.3 mL DEVI Inject 0.3 mLs (0.3 mg total) into the muscle once. 2 Device 1  . escitalopram (LEXAPRO) 10 MG tablet Take 1 tablet (10 mg total) by mouth daily. 90 tablet 3  . furosemide (LASIX) 20 MG tablet TAKE 1 TABLET (20 MG TOTAL) BY MOUTH DAILY. 90 tablet 1  . GLUCOSAMINE HCL PO Take 2 tablets by mouth daily.    . Multiple Vitamins-Minerals (CENTRUM SILVER PO) Take 1 tablet by mouth daily.    . Omega-3 Fatty Acids (FISH OIL PO) Take 1 tablet by mouth daily.    . rosuvastatin (CRESTOR) 5 MG tablet Take 1 tablet (5 mg total) by mouth daily. 90 tablet 3  . trimethoprim (TRIMPEX) 100 MG tablet      No current facility-administered medications on file  prior to visit.    No Known Allergies  Family History  Problem Relation Age of Onset  . Cancer Sister     Breast Cancer  . Colon cancer Neg Hx     BP 122/80 mmHg  Pulse 62  Temp(Src) 98.1 F (36.7 C) (Oral)  Ht  (1.549 m)  Wt 144 lb (65.318 kg)  BMI 27.22 kg/m2  SpO2 96%   Review of Systems  Constitutional: Negative for fever.  HENT: Negative for hearing loss.   Eyes: Negative for visual disturbance.  Respiratory: Negative for shortness of breath.   Cardiovascular: Negative for chest pain.  Gastrointestinal: Negative for anal bleeding.  Endocrine: Negative for cold intolerance.  Genitourinary: Negative for hematuria.  Musculoskeletal: Negative for back pain.  Allergic/Immunologic: Negative for environmental allergies.  Neurological:       Depression is well-controlled  Hematological: Does not bruise/bleed easily.  Psychiatric/Behavioral: Negative for dysphoric mood.       Objective:   Physical Exam VS: see vs page GEN: no distress HEAD: head: no deformity eyes: no periorbital swelling, no proptosis external nose and ears are normal mouth: no lesion seen NECK: supple, thyroid is not enlarged CHEST WALL: no deformity LUNGS: clear to auscultation CV: reg rate and rhythm, no murmur ABD:  abdomen is soft, nontender.  no hepatosplenomegaly.  not distended.  no hernia MUSCULOSKELETAL: muscle bulk and strength are grossly normal.  no obvious joint swelling.  gait is normal and steady EXTEMITIES: no deformity.  no ulcer on the feet.  feet are of normal color and temp.  no edema PULSES: dorsalis pedis intact bilat.  no carotid bruit NEURO:  cn 2-12 grossly intact.   readily moves all 4's.  sensation is intact to touch on the feet SKIN:  Normal texture and temperature.  No rash or suspicious lesion is visible.   NODES:  None palpable at the neck PSYCH: alert, well-oriented.  Does not appear anxious nor depressed.       Assessment & Plan:  Wellness visit  today, with problems stable, except as noted.    SEPARATE EVALUATION FOLLOWS--EACH PROBLEM HERE IS NEW, NOT RESPONDING TO TREATMENT, OR POSES SIGNIFICANT RISK TO THE PATIENT'S HEALTH: HISTORY OF THE PRESENT ILLNESS: Pt states few months of moderate pain at the left arm, but no assoc numbness.  PAST MEDICAL HISTORY reviewed and up to date today REVIEW OF SYSTEMS: No arm rash PHYSICAL EXAMINATION: VITAL SIGNS:  See vs page GENERAL: no distress LUE: normal temp and color.  No tend/swell/erythema LAB/XRAY RESULTS: IMPRESSION: Arm pain, new, uncertain etiology. She declines pain med PLAN:  Check x-ray Call if you want to see specialist

## 2015-07-26 NOTE — Progress Notes (Signed)
we discussed code status.  pt requests full code, but would not want to be started or maintained on artificial life-support measures if there was not a reasonable chance of recovery 

## 2015-07-26 NOTE — Patient Instructions (Addendum)
please consider these measures for your health:  minimize alcohol.  do not use tobacco products.  have a colonoscopy at least every 10 years from age 75.  Women should have an annual mammogram from age 10340.  keep firearms safely stored.  always use seat belts.  have working smoke alarms in your home.  see an eye doctor and dentist regularly.  never drive under the influence of alcohol or drugs (including prescription drugs).   X-rays are requested for you today.  We'll let you know about the results. it is critically important to prevent falling down (keep floor areas well-lit, dry, and free of loose objects.  If you have a cane, walker, or wheelchair, you should use it, even for short trips around the house.  Wear flat-soled shoes.  Also, try not to rush) Please let me know if you want to see a specialist for your left arm pain

## 2015-07-29 ENCOUNTER — Telehealth: Payer: Self-pay | Admitting: Endocrinology

## 2015-07-29 MED ORDER — TRAMADOL HCL 50 MG PO TABS
50.0000 mg | ORAL_TABLET | Freq: Three times a day (TID) | ORAL | Status: DC | PRN
Start: 2015-07-29 — End: 2015-08-14

## 2015-07-29 NOTE — Telephone Encounter (Signed)
i printed rx. If you require medication, you should see a specialist.  Please let me know.

## 2015-07-29 NOTE — Telephone Encounter (Signed)
I contacted the pt and advised of note below. Requested a call back if the pt to discuss how she would like to proceed.

## 2015-07-29 NOTE — Telephone Encounter (Signed)
I contacted the pt and advised Dr. Everardo AllEllison has sent a rx for Tramadol to help with her arm pain. Pt stated she would call back and let us know if she would like to pursue a specialist.

## 2015-07-29 NOTE — Telephone Encounter (Signed)
See note below and please advise, Thanks! 

## 2015-07-29 NOTE — Telephone Encounter (Signed)
Patient is returning your call, please call this number [phone]

## 2015-07-29 NOTE — Telephone Encounter (Signed)
Patient state that her X ray was normal but she is still having pain in that area, could you prescribe her something for pain, please advise

## 2015-08-05 ENCOUNTER — Telehealth: Payer: Self-pay | Admitting: Endocrinology

## 2015-08-05 DIAGNOSIS — M79602 Pain in left arm: Secondary | ICD-10-CM

## 2015-08-05 NOTE — Telephone Encounter (Signed)
done

## 2015-08-05 NOTE — Telephone Encounter (Signed)
Pt.notified

## 2015-08-05 NOTE — Telephone Encounter (Signed)
See note below and please advise, Thanks! 

## 2015-08-05 NOTE — Telephone Encounter (Signed)
Patient stated she had her x ray of her arm, and they didn't see anything in the x ray.  She's continues to have some discomfort,calling to be referred to a specialist. Please advise

## 2015-08-14 ENCOUNTER — Other Ambulatory Visit: Payer: Self-pay | Admitting: Endocrinology

## 2015-08-15 NOTE — Telephone Encounter (Signed)
i printed If you still have pain at your arm, you should see a specialist.  Ok with you?

## 2015-08-16 NOTE — Telephone Encounter (Signed)
I contacted the pt and left a vm advising rx has been faxed and to please call us back if she would like to see a specialist.

## 2015-08-22 ENCOUNTER — Other Ambulatory Visit: Payer: Self-pay

## 2015-08-22 ENCOUNTER — Encounter: Payer: Self-pay | Admitting: Family Medicine

## 2015-08-22 ENCOUNTER — Ambulatory Visit (INDEPENDENT_AMBULATORY_CARE_PROVIDER_SITE_OTHER): Payer: Medicare Other | Admitting: Family Medicine

## 2015-08-22 VITALS — BP 108/82 | HR 61 | Ht 61.0 in | Wt 141.0 lb

## 2015-08-22 DIAGNOSIS — M25512 Pain in left shoulder: Secondary | ICD-10-CM

## 2015-08-22 DIAGNOSIS — M7502 Adhesive capsulitis of left shoulder: Secondary | ICD-10-CM

## 2015-08-22 DIAGNOSIS — M7500 Adhesive capsulitis of unspecified shoulder: Secondary | ICD-10-CM | POA: Insufficient documentation

## 2015-08-22 NOTE — Patient Instructions (Signed)
Good to see you.  Ice 20 minutes 2 times daily. Usually after activity and before bed. Exercises 3 times a week.  pennsaid pinkie amount topically 2 times daily as needed.  Vitamin D 2000 IU daily  Turmeric 500mg  daily  Avoid any movement with your arm outside your peripheral vison with lifting See me again in 4 weeks and you should be doing well. Sometimes we will need to do another injection at that time.

## 2015-08-22 NOTE — Progress Notes (Signed)
Pre visit review using our clinic review tool, if applicable. No additional management support is needed unless otherwise documented below in the visit note. 

## 2015-08-22 NOTE — Progress Notes (Signed)
Tawana ScaleZach Smith D.O. [facility] [address] [place], [place] [address] Phone: [phone] Subjective:    I'm seeing this patient by the request  of:  Romero BellingELLISON, SEAN, MD   CC: Shoulder and arm pain  BJY:[id number]:Subjective Tanya Espinoza is a 75 y.o. female coming in with complaint of left shoulder and arm pain. Been going on for 2-3 months. Does not remember any injury. Has noticed that his been more of an insidious onset. Intermittent pain. Starting to have decreased range of motion. Radiate towards the humerus. Did have x-rays that were independently visualized by me of her humerus that were unremarkable for any bony abnormality. Patient states that sometimes can wake her up at night. Sometimes makes it difficult to do activities of daily living such as dressing. Patient will be traveling and states that she would like to not be in pain. No weakness of the upper actually. Denies fevers, chills, any abnormal weight loss. No true associated neck pain.     Past Medical History  Diagnosis Date  . HYPERLIPIDEMIA 11/30/2006  . DEPRESSION 11/30/2006  . HYPERTENSION 11/30/2006  . BRADYCARDIA 08/23/2007  . PSORIASIS 11/30/2006  . OSTEOPOROSIS 11/30/2006  . ASYMPTOMATIC POSTMENOPAUSAL STATUS 08/23/2007  . UTI'S, HX OF 11/30/2006   Past Surgical History  Procedure Laterality Date  . Eye surgery      cataracts bil.   Social History   Social History  . Marital Status: Married    Spouse Name: N/A  . Number of Children: N/A  . Years of Education: N/A   Occupational History  . Insurance    Social History Main Topics  . Smoking status: Never Smoker   . Smokeless tobacco: Never Used  . Alcohol Use: No  . Drug Use: No  . Sexual Activity: Yes    Birth Control/ Protection: None   Other Topics Concern  . None   Social History Narrative   No Known Allergies Family History  Problem Relation Age of Onset  . Cancer Sister     Breast Cancer  . Colon cancer Neg Hx     Past medical  history, social, surgical and family history all reviewed in electronic medical record.  No pertanent information unless stated regarding to the chief complaint.   Review of Systems: No headache, visual changes, nausea, vomiting, diarrhea, constipation, dizziness, abdominal pain, skin rash, fevers, chills, night sweats, weight loss, swollen lymph nodes, body aches, joint swelling, muscle aches, chest pain, shortness of breath, mood changes.   Objective Blood pressure 108/82, pulse 61, height 5\' 1"  (1.549 m), weight 141 lb (63.957 kg), SpO2 95 %.  General: No apparent distress alert and oriented x3 mood and affect normal, dressed appropriately.  HEENT: Pupils equal, extraocular movements intact  Respiratory: Patient's speak in full sentences and does not appear short of breath  Cardiovascular: No lower extremity edema, non tender, no erythema  Skin: Warm dry intact with no signs of infection or rash on extremities or on axial skeleton.  Abdomen: Soft nontender  Neuro: Cranial nerves II through XII are intact, neurovascularly intact in all extremities with 2+ DTRs and 2+ pulses.  Lymph: No lymphadenopathy of posterior or anterior cervical chain or axillae bilaterally.  Gait normal with good balance and coordination.  MSK:  Non tender with full range of motion and good stability and symmetric strength and tone of , elbows, wrist, hip, knee and ankles bilaterally.  Neck: Inspection unremarkable. No palpable stepoffs. Negative Spurling's maneuver. Full neck range of motion Grip  strength and sensation normal in bilateral hands Strength good C4 to T1 distribution No sensory change to C4 to T1 Negative Hoffman sign bilaterally Reflexes normal  Shoulder: left Inspection reveals no abnormalities, atrophy or asymmetry. Palpation is normal with no tenderness over AC joint or bicipital groove. Mild limitation in range of motion lacking the last 10 of forward flexion and has internal rotation to  sacrum and external rotation of 5. Rotator cuff strength normal throughout. signs of impingement with positive Neer and Hawkin's tests, but negative empty can sign. Speeds and Yergason's tests normal. No labral pathology noted with negative Obrien's, negative clunk and good stability. Normal scapular function observed. No painful arc and no drop arm sign. No apprehension sign  MSK US performed of: left This study was ordered, performed, and interpreted by Terrilee Files D.O.  Shoulder:   Supraspinatus:  Mild intersubstance tearing noted with calcific changes. No retraction. Infraspinatus:  Appears normal on long and transverse views. Significant increase in Doppler flow Subscapularis:  Mild intersubstance tearing noted thickening of the anterior capsule Teres Minor:  Appears normal on long and transverse views. AC joint:  Mild arthritic changes Glenohumeral Joint:  Appears normal without effusion. Glenoid Labrum:  Intact without visualized tears. Biceps Tendon:  Positive bull's-eye sign  Impression: Subacromial bursitis, mild intersubstance tear of the supraspinatus and early frozen shoulder  Procedure: Real-time Ultrasound Guided Injection of left glenohumeral joint Device: GE Logiq E  Ultrasound guided injection is preferred based studies that show increased duration, increased effect, greater accuracy, decreased procedural pain, increased response rate with ultrasound guided versus blind injection.  Verbal informed consent obtained.  Time-out conducted.  Noted no overlying erythema, induration, or other signs of local infection.  Skin prepped in a sterile fashion.  Local anesthesia: Topical Ethyl chloride.  With sterile technique and under real time ultrasound guidance:  Joint visualized.  23g 1  inch needle inserted posterior approach. Pictures taken for needle placement. Patient did have injection of 2 cc of 1% lidocaine, 2 cc of 0.5% Marcaine, and 1.0 cc of Kenalog 40  mg/dL. Completed without difficulty  Pain immediately resolved suggesting accurate placement of the medication.  Advised to call if fevers/chills, erythema, induration, drainage, or persistent bleeding.  Images permanently stored and available for review in the ultrasound unit.  Impression: Technically successful ultrasound guided injection.  Procedure note 97110; 15 minutes spent for Therapeutic exercises as stated in above notes.  This included exercises focusing on stretching, strengthening, with significant focus on eccentric aspects.  Shoulder Exercises that included:  Basic scapular stabilization to include adduction and depression of scapula Scaption, focusing on proper movement and good control Internal and External rotation utilizing a theraband, with elbow tucked at side entire time Rows with theraband   Proper technique shown and discussed handout in great detail with ATC.  All questions were discussed and answered.     Impression and Recommendations:     This case required medical decision making of moderate complexity.      Note: This dictation was prepared with Dragon dictation along with smaller phrase technology. Any transcriptional errors that result from this process are unintentional.

## 2015-08-22 NOTE — Assessment & Plan Note (Signed)
Patient given injection today and tolerated the procedure well. We discussed icing regimen and home exercises. We discussed which activities to do in which ones to avoid. Patient work with Event organiserathletic trainer to learn home exercises in greater detail was given trial of topical anti-inflammatories. Patient does have tramadol for break through pain we discussed possible Tylenol. Patient come back and see me again in 4 weeks. If continuing have trouble we'll consider formal physical therapy and possible repeat injection.

## 2015-09-19 ENCOUNTER — Encounter: Payer: Self-pay | Admitting: Family Medicine

## 2015-09-19 ENCOUNTER — Ambulatory Visit (INDEPENDENT_AMBULATORY_CARE_PROVIDER_SITE_OTHER): Payer: Medicare Other | Admitting: Family Medicine

## 2015-09-19 VITALS — BP 118/80 | HR 64 | Ht 61.0 in | Wt 138.0 lb

## 2015-09-19 DIAGNOSIS — M7502 Adhesive capsulitis of left shoulder: Secondary | ICD-10-CM | POA: Diagnosis not present

## 2015-09-19 DIAGNOSIS — L03112 Cellulitis of left axilla: Secondary | ICD-10-CM | POA: Diagnosis not present

## 2015-09-19 MED ORDER — DOXYCYCLINE HYCLATE 100 MG PO TABS: 100.0000 mg | ORAL_TABLET | Freq: Two times a day (BID) | ORAL | Status: AC

## 2015-09-19 NOTE — Progress Notes (Signed)
Tanya Espinoza D.O. [facility] [address] [place], [place] [address] Phone: [phone] Subjective:    I'm seeing this patient by the request  of:  Romero BellingELLISON, SEAN, MD   CC: Shoulder and arm pain follow-up  ZHY:[id number]:Subjective Tanya Espinoza is a 75 y.o. female coming in with complaint of left shoulder and arm pain. 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 Was given an injection. States that she is 97% better. Patient has been doing the exercises religiously. An states that it feels much better overall. No radiation down the arm. Patient does state though at one point she may have had a bite or she had significant redness in her armpits. Patient sates that the redness has improved but there is to be a little mass. States that it is tender to palpation. Denies any fevers chills or any abnormal weight loss. Seems different than her shoulder pain. States though that overall she seems to be doing relatively well. No fevers, chills, any abnormal weight loss.     Past Medical History  Diagnosis Date  . HYPERLIPIDEMIA 11/30/2006  . DEPRESSION 11/30/2006  . HYPERTENSION 11/30/2006  . BRADYCARDIA 08/23/2007  . PSORIASIS 11/30/2006  . OSTEOPOROSIS 11/30/2006  . ASYMPTOMATIC POSTMENOPAUSAL STATUS 08/23/2007  . UTI'S, HX OF 11/30/2006   Past Surgical History  Procedure Laterality Date  . Eye surgery      cataracts bil.   Social History   Social History  . Marital Status: Married    Spouse Name: N/A  . Number of Children: N/A  . Years of Education: N/A   Occupational History  . Insurance    Social History Main Topics  . Smoking status: Never Smoker   . Smokeless tobacco: Never Used  . Alcohol Use: No  . Drug Use: No  . Sexual Activity: Yes    Birth Control/ Protection: None   Other Topics Concern  . None   Social History Narrative   No Known Allergies Family History  Problem Relation Age of Onset  . Cancer Sister     Breast Cancer  . Colon cancer Neg  Hx     Past medical history, social, surgical and family history all reviewed in electronic medical record.  No pertanent information unless stated regarding to the chief complaint.   Review of Systems: No headache, visual changes, nausea, vomiting, diarrhea, constipation, dizziness, abdominal pain, skin rash, fevers, chills, night sweats, weight loss, swollen lymph nodes, body aches, joint swelling, muscle aches, chest pain, shortness of breath, mood changes.   Objective Blood pressure 118/80, pulse 64, height 5\' 1"  (1.549 m), weight 138 lb (62.596 kg), SpO2 98 %.  General: No apparent distress alert and oriented x3 mood and affect normal, dressed appropriately.  HEENT: Pupils equal, extraocular movements intact  Respiratory: Patient's speak in full sentences and does not appear short of breath  Cardiovascular: No lower extremity edema, non tender, no erythema  Skin: Warm dry intact with no signs of infection or rash on extremities or on axial skeleton.  Abdomen: Soft nontender  Neuro: Cranial nerves II through XII are intact, neurovascularly intact in all extremities with 2+ DTRs and 2+ pulses.  Lymph: No lymphadenopathy of posterior or anterior cervical chain or axillae bilaterally.  Gait normal with good balance and coordination.  MSK:  Non tender with full range of motion and good stability and symmetric strength and tone of , elbows, wrist, hip, knee and ankles bilaterally.  Neck: Inspection unremarkable. No palpable stepoffs. Negative  Spurling's maneuver. Full neck range of motion Grip strength and sensation normal in bilateral hands Strength good C4 to T1 distribution No sensory change to C4 to T1 Negative Hoffman sign bilaterally Reflexes normal  Shoulder: left Inspection reveals no abnormalities, atrophy or asymmetry. Patient's axillary area though does have mild redness. Seems to be minorly warm to touch. Patient does have what appears to be a fluctuant in this area. No  true mass appreciated. No mass in the counter breast tissue either. Palpation is normal with no tenderness over AC joint or bicipital groove. Near full range of motion at this time Rotator cuff strength normal throughout. Mild signs of impingement still remaining Speeds and Yergason's tests normal. No labral pathology noted with negative Obrien's, negative clunk and good stability. Normal scapular function observed. No painful arc and no drop arm sign. No apprehension sign      Impression and Recommendations:     This case required medical decision making of moderate complexity.      Note: This dictation was prepared with Dragon dictation along with smaller phrase technology. Any transcriptional errors that result from this process are unintentional.

## 2015-09-19 NOTE — Progress Notes (Signed)
Pre visit review using our clinic review tool, if applicable. No additional management support is needed unless otherwise documented below in the visit note. 

## 2015-09-19 NOTE — Patient Instructions (Addendum)
Good to see you  Ice 20 minutes 2 times daily. Usually after activity and before bed. Keep working on the range of motion for the shoulder.  Aspirin 81 mg daily for next 6 weeks.  Doxycycline 1 pill 2 times daily for 10 days See me again in 3 weeks to make sure thew redness in the arm is completely gone.  If redness worsens, more pain or bigger area involved please seek medical attention immediately.

## 2015-09-19 NOTE — Assessment & Plan Note (Signed)
Patient responded very well to the injection. Encourage patient to continue to work on range of motion. I do think the shoulder continue to improve. Patient though does have an area that seems to be maybe some infectious etiology. Given doxycycline prophylactically. Discussed with patient at thing that seems to bear or any streaking to seek medical attention immediately. Patient come back again in 3 weeks to make sure patient is responding.

## 2015-09-19 NOTE — Assessment & Plan Note (Signed)
Believe the patient does likely have a cellulitis. Patient given doxycycline for MRSA coverage. Also cover for Disease. Discussed with patient going to mask is worse or any streaking to seek medical attention immediately. If any type of clot it would be superficial also we did discuss possible aspirin. Do not feel that there is any chance for deep venous thrombosis with patient very minimally tender. Patient is to follow-up otherwise in the next 3 weeks to make sure completely resolved. Patient will call if there is any significant changes.

## 2015-10-10 NOTE — Progress Notes (Signed)
[facility] [address] [place], [place] [address] Phone: [phone] Subjective:    I'm seeing this patient by the request  of:  Romero Belling, MD   CC: Shoulder and arm pain follow-up  [id number]  Tanya Espinoza is a 75 y.o. female coming in with complaint of left shoulder and arm pain. 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 Was given an injection. Was 97% better at last exam.  States that she is percent better. The redness that she had previously is completely resolved at this time as well. Has no problems with the shoulder in all    Past Medical History:  Diagnosis Date  . ASYMPTOMATIC POSTMENOPAUSAL STATUS 08/23/2007  . BRADYCARDIA 08/23/2007  . DEPRESSION 11/30/2006  . HYPERLIPIDEMIA 11/30/2006  . HYPERTENSION 11/30/2006  . OSTEOPOROSIS 11/30/2006  . PSORIASIS 11/30/2006  . UTI'S, HX OF 11/30/2006   Past Surgical History:  Procedure Laterality Date  . EYE SURGERY     cataracts bil.   Social History   Social History  . Marital status: Married    Spouse name: N/A  . Number of children: N/A  . Years of education: N/A   Occupational History  . Insurance    Social History Main Topics  . Smoking status: Never Smoker  . Smokeless tobacco: Never Used  . Alcohol use No  . Drug use: No  . Sexual activity: Yes    Birth control/ protection: None   Other Topics Concern  . None   Social History Narrative  . None   No Known Allergies Family History  Problem Relation Age of Onset  . Cancer Sister     Breast Cancer  . Colon cancer Neg Hx     Past medical history, social, surgical and family history all reviewed in electronic medical record.  No pertanent information unless stated regarding to the chief complaint.   Review of Systems: No headache, visual changes, nausea, vomiting, diarrhea, constipation, dizziness, abdominal pain, skin rash, fevers, chills, night sweats, weight loss, swollen lymph nodes, body  aches, joint swelling, muscle aches, chest pain, shortness of breath, mood changes.   Objective  Blood pressure 100/70, pulse 70, weight 137 lb (62.1 kg), SpO2 99 %.  General: No apparent distress alert and oriented x3 mood and affect normal, dressed appropriately.  HEENT: Pupils equal, extraocular movements intact  Respiratory: Patient's speak in full sentences and does not appear short of breath  Cardiovascular: No lower extremity edema, non tender, no erythema  Skin: Warm dry intact with no signs of infection or rash on extremities or on axial skeleton.  Abdomen: Soft nontender  Neuro: Cranial nerves II through XII are intact, neurovascularly intact in all extremities with 2+ DTRs and 2+ pulses.  Lymph: No lymphadenopathy of posterior or anterior cervical chain or axillae bilaterally.  Gait normal with good balance and coordination.  MSK:  Non tender with full range of motion and good stability and symmetric strength and tone of , elbows, wrist, hip, knee and ankles bilaterally.  Neck: Inspection unremarkable. No palpable stepoffs. Negative Spurling's maneuver. Full neck range of motion Grip strength and sensation normal in bilateral hands Strength good C4 to T1 distribution No sensory change to C4 to T1 Negative Hoffman sign bilaterally Reflexes normal  Shoulder: left Inspection reveals no abnormalities, atrophy or asymmetry. Previous area of redness is completely unremarkable full range of motion at this time Rotator cuff strength normal throughout. No impingement signs Speeds and Yergason's tests  normal. No labral pathology noted with negative Obrien's, negative clunk and good stability. Normal scapular function observed. No painful arc and no drop arm sign. No apprehension sign Contralateral shoulder unremarkable     Impression and Recommendations:     This case required medical decision making of moderate complexity.      Note: This dictation was prepared with  Dragon dictation along with smaller phrase technology. Any transcriptional errors that result from this process are unintentional.

## 2015-10-11 ENCOUNTER — Ambulatory Visit (INDEPENDENT_AMBULATORY_CARE_PROVIDER_SITE_OTHER): Payer: Medicare Other | Admitting: Family Medicine

## 2015-10-11 ENCOUNTER — Encounter: Payer: Self-pay | Admitting: Family Medicine

## 2015-10-11 DIAGNOSIS — M7502 Adhesive capsulitis of left shoulder: Secondary | ICD-10-CM | POA: Diagnosis not present

## 2015-10-11 DIAGNOSIS — L03112 Cellulitis of left axilla: Secondary | ICD-10-CM

## 2015-10-11 NOTE — Patient Instructions (Addendum)
Great to see you  I am so happy you are doing better.  Keep doing what you are doing.  See me when you need me.

## 2015-10-11 NOTE — Assessment & Plan Note (Signed)
Likely was secondary to more of the bite. Patient had been on antibiotic. Seems to be doing very well at the time. No signs of infection at this time. Follow-up as needed.

## 2015-10-11 NOTE — Assessment & Plan Note (Signed)
Resolve after injection. Patient is going to continue home exercises and will follow-up as needed.

## 2015-10-24 ENCOUNTER — Other Ambulatory Visit: Payer: Self-pay | Admitting: Endocrinology

## 2015-11-27 ENCOUNTER — Ambulatory Visit: Payer: Medicare Other | Admitting: Endocrinology

## 2015-12-03 ENCOUNTER — Encounter: Payer: Self-pay | Admitting: Endocrinology

## 2015-12-03 ENCOUNTER — Ambulatory Visit (INDEPENDENT_AMBULATORY_CARE_PROVIDER_SITE_OTHER): Payer: Medicare Other | Admitting: Endocrinology

## 2015-12-03 VITALS — BP 110/82 | HR 54 | Ht 61.0 in | Wt 139.0 lb

## 2015-12-03 DIAGNOSIS — Z0000 Encounter for general adult medical examination without abnormal findings: Secondary | ICD-10-CM | POA: Diagnosis not present

## 2015-12-03 DIAGNOSIS — K769 Liver disease, unspecified: Secondary | ICD-10-CM

## 2015-12-03 DIAGNOSIS — M810 Age-related osteoporosis without current pathological fracture: Secondary | ICD-10-CM

## 2015-12-03 DIAGNOSIS — I10 Essential (primary) hypertension: Secondary | ICD-10-CM | POA: Diagnosis not present

## 2015-12-03 DIAGNOSIS — Z23 Encounter for immunization: Secondary | ICD-10-CM | POA: Diagnosis not present

## 2015-12-03 DIAGNOSIS — R946 Abnormal results of thyroid function studies: Secondary | ICD-10-CM | POA: Diagnosis not present

## 2015-12-03 DIAGNOSIS — D649 Anemia, unspecified: Secondary | ICD-10-CM

## 2015-12-03 DIAGNOSIS — R7989 Other specified abnormal findings of blood chemistry: Secondary | ICD-10-CM

## 2015-12-03 LAB — BASIC METABOLIC PANEL
BUN: 15 mg/dL (ref 6–23)
CALCIUM: 9.1 mg/dL (ref 8.4–10.5)
CHLORIDE: 105 meq/L (ref 96–112)
CO2: 29 meq/L (ref 19–32)
CREATININE: 0.94 mg/dL (ref 0.40–1.20)
GFR: 75.03 mL/min (ref >60.00)
GLUCOSE: 83 mg/dL (ref 70–99)
Potassium: 3.6 mEq/L (ref 3.5–5.1)
Sodium: 140 mEq/L (ref 135–145)

## 2015-12-03 LAB — LIPID PANEL
CHOL/HDL RATIO: 3
CHOLESTEROL: 227 mg/dL — AB (ref 0–200)
HDL: 70.6 mg/dL (ref >39.00)
LDL CALC: 145 mg/dL — AB (ref 0–99)
NONHDL: 156.72
Triglycerides: 57 mg/dL (ref 0.0–149.0)
VLDL: 11.4 mg/dL (ref 0.0–40.0)

## 2015-12-03 LAB — CBC WITH DIFFERENTIAL/PLATELET
BASOS ABS: 0 10*3/uL (ref 0.0–0.1)
Basophils Relative: 0.5 % (ref 0.0–3.0)
EOS PCT: 1 % (ref 0.0–5.0)
Eosinophils Absolute: 0.1 10*3/uL (ref 0.0–0.7)
HEMATOCRIT: 38.2 % (ref 36.0–46.0)
Hemoglobin: 13 g/dL (ref 12.0–15.0)
LYMPHS ABS: 1.9 10*3/uL (ref 0.7–4.0)
LYMPHS PCT: 22.9 % (ref 12.0–46.0)
MCHC: 34.1 g/dL (ref 30.0–36.0)
MCV: 90.1 fl (ref 78.0–100.0)
MONOS PCT: 4.8 % (ref 3.0–12.0)
Monocytes Absolute: 0.4 10*3/uL (ref 0.1–1.0)
NEUTROS PCT: 70.8 % (ref 43.0–77.0)
Neutro Abs: 5.8 10*3/uL (ref 1.4–7.7)
Platelets: 207 10*3/uL (ref 150.0–400.0)
RBC: 4.24 Mil/uL (ref 3.87–5.11)
RDW: 14 % (ref 11.5–15.5)
WBC: 8.2 10*3/uL (ref 4.0–10.5)

## 2015-12-03 LAB — HEPATIC FUNCTION PANEL
ALK PHOS: 47 U/L (ref 39–117)
ALT: 22 U/L (ref 0–35)
AST: 27 U/L (ref 0–37)
Albumin: 4.1 g/dL (ref 3.5–5.2)
BILIRUBIN DIRECT: 0 mg/dL (ref 0.0–0.3)
BILIRUBIN TOTAL: 0.5 mg/dL (ref 0.2–1.2)
TOTAL PROTEIN: 7 g/dL (ref 6.0–8.3)

## 2015-12-03 LAB — IBC PANEL
Iron: 130 ug/dL (ref 42–145)
SATURATION RATIOS: 36.7 % (ref 20.0–50.0)
TRANSFERRIN: 253 mg/dL (ref 212.0–360.0)

## 2015-12-03 LAB — TSH: TSH: 0.89 u[IU]/mL (ref 0.35–4.50)

## 2015-12-03 NOTE — Progress Notes (Signed)
we discussed code status.  pt requests full code, but would not want to be started or maintained on artificial life-support measures if there was not a reasonable chance of recovery 

## 2015-12-03 NOTE — Patient Instructions (Addendum)
Please consider these measures for your health:  minimize alcohol.  Do not use tobacco products.  Have a colonoscopy at least every 10 years from age 75.  Women should have an annual mammogram from age 73.  Keep firearms safely stored.  Always use seat belts.  have working smoke alarms in your home.  See an eye doctor and dentist regularly.  Never drive under the influence of alcohol or drugs (including prescription drugs).  please let me know what your wishes would be, if artificial life support measures should become necessary.  It is critically important to prevent falling down (keep floor areas well-lit, dry, and free of loose objects.  If you have a cane, walker, or wheelchair, you should use it, even for short trips around the house.  Wear flat-soled shoes.  Also, try not to rush). good diet and exercise significantly improve your health.  please let me know if you wish to be referred to a dietician.  high blood sugar is very risky to your health.  you should see an eye doctor and dentist every year.  It is very important to get all recommended vaccinations.   blood tests are requested for you today.  We'll let you know about the results.   Please return in 1 year.

## 2015-12-03 NOTE — Progress Notes (Signed)
Subjective:    Patient ID: Tanya Espinoza, female    DOB: 22-Jan-1941, 75 y.o.   MRN: [id number]  HPI Pt states few days of slight sensation both ears "blocked," but no assoc pain.   Past Medical History:  Diagnosis Date  . ASYMPTOMATIC POSTMENOPAUSAL STATUS 08/23/2007  . BRADYCARDIA 08/23/2007  . DEPRESSION 11/30/2006  . HYPERLIPIDEMIA 11/30/2006  . HYPERTENSION 11/30/2006  . OSTEOPOROSIS 11/30/2006  . PSORIASIS 11/30/2006  . UTI'S, HX OF 11/30/2006    Past Surgical History:  Procedure Laterality Date  . EYE SURGERY     cataracts bil.    Social History   Social History  . Marital status: Married    Spouse name: N/A  . Number of children: N/A  . Years of education: N/A   Occupational History  . Insurance    Social History Main Topics  . Smoking status: Never Smoker  . Smokeless tobacco: Never Used  . Alcohol use No  . Drug use: No  . Sexual activity: Yes    Birth control/ protection: None   Other Topics Concern  . Not on file   Social History Narrative  . No narrative on file    Current Outpatient Prescriptions on File Prior to Visit  Medication Sig Dispense Refill  . Calcium Carb-Cholecalciferol (CALCIUM + D3 PO) Take 1 tablet by mouth daily.    Marland Kitchen EPINEPHrine (EPIPEN) 0.3 mg/0.3 mL DEVI Inject 0.3 mLs (0.3 mg total) into the muscle once. 2 Device 1  . escitalopram (LEXAPRO) 10 MG tablet Take 1 tablet (10 mg total) by mouth daily. 90 tablet 3  . furosemide (LASIX) 20 MG tablet TAKE 1 TABLET DAILY 90 tablet 1  . GLUCOSAMINE HCL PO Take 2 tablets by mouth daily.    . Multiple Vitamins-Minerals (CENTRUM SILVER PO) Take 1 tablet by mouth daily.    . Omega-3 Fatty Acids (FISH OIL PO) Take 1 tablet by mouth daily.    . rosuvastatin (CRESTOR) 5 MG tablet Take 1 tablet (5 mg total) by mouth daily. 90 tablet 3  . traMADol (ULTRAM) 50 MG tablet TAKE 1 TABLET BY MOUTH EVERY 8 HOURS AS NEEDED 30 tablet 0  . trimethoprim (TRIMPEX) 100 MG tablet      No current  facility-administered medications on file prior to visit.     No Known Allergies  Family History  Problem Relation Age of Onset  . Cancer Sister     Breast Cancer  . Colon cancer Neg Hx     BP 110/82   Pulse (!) 54   Ht 5\' 1"  (1.549 m)   Wt 139 lb (63 kg)   SpO2 91%   BMI 26.26 kg/m   Review of Systems Denies chest pain and sob.  Depression is well-controlled.      Objective:   Physical Exam VITAL SIGNS:  See vs page GENERAL: no distress Both eac's are occluded with cerumen.   Intervention: both eac's are irrigated.  Recheck: both eac's and tm's are normal.     i personally reviewed electrocardiogram tracing (today): Indication: HTN Impression: no change.   Lab Results  Component Value Date   WBC 8.2 12/03/2015   HGB 13.0 12/03/2015   HCT 38.2 12/03/2015   PLT 207.0 12/03/2015   GLUCOSE 83 12/03/2015   CHOL 227 (H) 12/03/2015   TRIG 57.0 12/03/2015   HDL 70.60 12/03/2015   LDLCALC 145 (H) 12/03/2015   ALT 22 12/03/2015   AST 27 12/03/2015   NA 140 12/03/2015  K 3.6 12/03/2015   CL 105 12/03/2015   CREATININE 0.94 12/03/2015   BUN 15 12/03/2015   CO2 29 12/03/2015   TSH 0.89 12/03/2015      Assessment & Plan:  Cerumen occlusion, recurrent, resolved with irrigation.  Dyslipidemia: in view of high HDL, it is well-controlled.  HTN: well-controlled.  Please continue the same lasix. Depression: well-controlled     Subjective:   Patient here for Medicare annual wellness visit and management of other chronic and acute problems.     Risk factors: advanced age    Roster of Physicians Providing Medical Care to Patient:  See "snapshot"   Activities of Daily Living: In your present state of health, do you have any difficulty performing the following activities?:  Preparing food and eating?: No  Bathing yourself: No  Getting dressed: No  Using the toilet:No  Moving around from place to place: No  In the past year have you fallen or had a near  fall?:No    Home Safety: Has smoke detector and wears seat belts. firearms are safely stored.  Diet and Exercise  Current exercise habits: pt says good Dietary issues discussed: pt reports a healthy diet   Depression Screen  Q1: Over the past two weeks, have you felt down, depressed or hopeless? no  Q2: Over the past two weeks, have you felt little interest or pleasure in doing things? no   The following portions of the patient's history were reviewed and updated as appropriate: allergies, current medications, past family history, past medical history, past social history, past surgical history and problem list.   Review of Systems  Denies hearing loss, and visual loss Objective:   Vision:  [place] opthalmologist, so she declines [facility] today Hearing: grossly normal Body mass index:  See vs page Msk: pt easily and quickly performs "get-up-and-go" from a sitting position Cognitive Impairment Assessment: cognition, memory and judgment appear normal.  remembers 2/3 at 5 minutes (? effort).  excellent recall.  can easily read and write a sentence.  alert and oriented x 3.    Assessment:   Medicare wellness utd on preventive parameters    Plan:   During the course of the visit the patient was educated and counseled about appropriate screening and preventive services including:        Fall prevention   Screening mammography  Bone densitometry screening  Diabetes screening  Nutrition counseling   LABS are ordered today   Patient Instructions (the written plan) was given to the patient.

## 2015-12-04 LAB — URINALYSIS, ROUTINE W REFLEX MICROSCOPIC
Bilirubin Urine: NEGATIVE
HGB URINE DIPSTICK: NEGATIVE
KETONES UR: NEGATIVE
LEUKOCYTES UA: NEGATIVE
Nitrite: NEGATIVE
Specific Gravity, Urine: 1.01 (ref 1.000–1.030)
TOTAL PROTEIN, URINE-UPE24: NEGATIVE
URINE GLUCOSE: NEGATIVE
UROBILINOGEN UA: 0.2 (ref 0.0–1.0)
pH: 6 (ref 5.0–8.0)

## 2015-12-04 LAB — PTH, INTACT AND CALCIUM
Calcium: 9.3 mg/dL (ref 8.6–10.4)
PTH: 43 pg/mL (ref 14–64)

## 2015-12-23 ENCOUNTER — Other Ambulatory Visit: Payer: Self-pay | Admitting: Endocrinology

## 2015-12-23 DIAGNOSIS — Z1231 Encounter for screening mammogram for malignant neoplasm of breast: Secondary | ICD-10-CM

## 2016-01-01 ENCOUNTER — Other Ambulatory Visit: Payer: Self-pay | Admitting: Endocrinology

## 2016-01-02 ENCOUNTER — Telehealth: Payer: Self-pay | Admitting: Endocrinology

## 2016-01-02 NOTE — Telephone Encounter (Signed)
Patient stated that she is still having some discomfort in her arm where she got the flu shot.  Please advise

## 2016-01-03 ENCOUNTER — Telehealth: Payer: Self-pay

## 2016-01-03 NOTE — Telephone Encounter (Signed)
Called and left message for patient to call back regarding the discomfort from the flu shot. Gave call back number if having questions.

## 2016-02-03 ENCOUNTER — Ambulatory Visit
Admission: RE | Admit: 2016-02-03 | Discharge: 2016-02-03 | Disposition: A | Discharge status: Home / Self Care | Payer: Medicare Other | Source: Ambulatory Visit | Attending: Endocrinology | Admitting: Endocrinology

## 2016-02-03 DIAGNOSIS — Z1231 Encounter for screening mammogram for malignant neoplasm of breast: Secondary | ICD-10-CM

## 2016-04-21 ENCOUNTER — Other Ambulatory Visit: Payer: Self-pay | Admitting: Endocrinology

## 2016-06-23 ENCOUNTER — Ambulatory Visit: Payer: Self-pay

## 2016-06-23 ENCOUNTER — Encounter: Payer: Self-pay | Admitting: Family Medicine

## 2016-06-23 ENCOUNTER — Ambulatory Visit (INDEPENDENT_AMBULATORY_CARE_PROVIDER_SITE_OTHER): Payer: Medicare Other | Admitting: Family Medicine

## 2016-06-23 VITALS — BP 132/84 | HR 81 | Wt 141.4 lb

## 2016-06-23 DIAGNOSIS — M25512 Pain in left shoulder: Secondary | ICD-10-CM

## 2016-06-23 DIAGNOSIS — M7552 Bursitis of left shoulder: Secondary | ICD-10-CM | POA: Diagnosis not present

## 2016-06-23 NOTE — Assessment & Plan Note (Signed)
Patient does have left shoulder bursitis. This seems different than previous exam. Has near full range of motion of the shoulder today. No significant weakness but patient was found to have intersubstance tearing of the rotator cuff. We discussed icing regimen and home exercises. Patient work with Event organiser and given theraband.  \Patient is to increase activity slowly. Topical anti-inflammatories given. Follow-up again in 4-6 weeks

## 2016-06-23 NOTE — Progress Notes (Signed)
Pre-visit discussion using our clinic review tool. No additional management support is needed unless otherwise documented below in the visit note.  

## 2016-06-23 NOTE — Progress Notes (Signed)
[facility] [address] [place], [place] [address] Phone: [phone] Subjective:     CC: Left shoulder pain  [id number]  Tanya Espinoza is a 76 y.o. female coming in with complaint of left shoulder pain. Patient did see me in probably one year ago. Patient was diagnosed with more of a frozen shoulder. Responded very well to an injection. Patient states that this is similar but not as severe. Denies any weakness but states that sometimes when she is carrying something heavy she has to put it down quickly she feels like her arm is going to give. Patient denies any numbness though. Denies any nighttime awakening. Does affect daily activities such as certain range of motion. Rates the severity pain as 5 out of 10. Minimal improvement with over-the-counter medications     Past Medical History:  Diagnosis Date  . ASYMPTOMATIC POSTMENOPAUSAL STATUS 08/23/2007  . BRADYCARDIA 08/23/2007  . DEPRESSION 11/30/2006  . HYPERLIPIDEMIA 11/30/2006  . HYPERTENSION 11/30/2006  . OSTEOPOROSIS 11/30/2006  . PSORIASIS 11/30/2006  . UTI'S, HX OF 11/30/2006   Past Surgical History:  Procedure Laterality Date  . EYE SURGERY     cataracts bil.   Social History   Social History  . Marital status: Married    Spouse name: N/A  . Number of children: N/A  . Years of education: N/A   Occupational History  . Insurance    Social History Main Topics  . Smoking status: Never Smoker  . Smokeless tobacco: Never Used  . Alcohol use No  . Drug use: No  . Sexual activity: Yes    Birth control/ protection: None   Other Topics Concern  . None   Social History Narrative  . None   No Known Allergies Family History  Problem Relation Age of Onset  . Cancer Sister     Breast Cancer  . Colon cancer Neg Hx     Past medical history, social, surgical and family history all reviewed in electronic medical record.  No pertanent information unless stated regarding to the chief  complaint.   Review of Systems:Review of systems updated and as accurate as of 06/23/16  No headache, visual changes, nausea, vomiting, diarrhea, constipation, dizziness, abdominal pain, skin rash, fevers, chills, night sweats, weight loss, swollen lymph nodes, body aches, joint swelling, muscle aches, chest pain, shortness of breath, mood changes.   Objective  Blood pressure 132/84, pulse 81, weight 141 lb 6 oz (64.1 kg), SpO2 94 %. Systems examined below as of 06/23/16   General: No apparent distress alert and oriented x3 mood and affect normal, dressed appropriately.  HEENT: Pupils equal, extraocular movements intact  Respiratory: Patient's speak in full sentences and does not appear short of breath  Cardiovascular: No lower extremity edema, non tender, no erythema  Skin: Warm dry intact with no signs of infection or rash on extremities or on axial skeleton.  Abdomen: Soft nontender  Neuro: Cranial nerves II through XII are intact, neurovascularly intact in all extremities with 2+ DTRs and 2+ pulses.  Lymph: No lymphadenopathy of posterior or anterior cervical chain or axillae bilaterally.  Gait normal with good balance and coordination.  MSK:  Non tender with full range of motion and good stability and symmetric strength and tone of  elbows, wrist, hip, knee and ankles bilaterally.  Shoulder: left Inspection reveals no abnormalities, atrophy or asymmetry. Palpation is normal with no tenderness over AC joint or bicipital groove. ROM is full in all planes passively.  Rotator cuff strength normal throughout. signs of impingement with positive Neer and Hawkin's tests, but negative empty can sign. Speeds and Yergason's tests normal. No labral pathology noted with negative Obrien's, negative clunk and good stability. Normal scapular function observed. No painful arc and no drop arm sign. No apprehension sign  MSK US performed of: left This study was ordered, performed, and interpreted by  Terrilee Files D.O.  Shoulder:   Supraspinatus:  Appears normal on long and transverse views, Bursal bulge seen with shoulder abduction on impingement view. Infraspinatus:  Appears normal on long and transverse views. Significant increase in Doppler flow Subscapularis:  Appears normal on long and transverse views. Positive bursa Teres Minor:  Appears normal on long and transverse views. AC joint:  Capsule undistended, no geyser sign. Glenohumeral Joint:  Appears normal without effusion. Glenoid Labrum:  Intact without visualized tears. Biceps Tendon:  Appears normal on long and transverse views, no fraying of tendon, tendon located in intertubercular groove, no subluxation with shoulder internal or external rotation.  Impression: Subacromial bursitis  Procedure: Real-time Ultrasound Guided Injection of left glenohumeral joint Device: GE Logiq E  Ultrasound guided injection is preferred based studies that show increased duration, increased effect, greater accuracy, decreased procedural pain, increased response rate with ultrasound guided versus blind injection.  Verbal informed consent obtained.  Time-out conducted.  Noted no overlying erythema, induration, or other signs of local infection.  Skin prepped in a sterile fashion.  Local anesthesia: Topical Ethyl chloride.  With sterile technique and under real time ultrasound guidance:  Joint visualized.  23g 1  inch needle inserted posterior approach. Pictures taken for needle placement. Patient did have injection of 2 cc of 1% lidocaine, 2 cc of 0.5% Marcaine, and 1.0 cc of Kenalog 40 mg/dL. Completed without difficulty  Pain immediately resolved suggesting accurate placement of the medication.  Advised to call if fevers/chills, erythema, induration, drainage, or persistent bleeding.  Images permanently stored and available for review in the ultrasound unit.  Impression: Technically successful ultrasound guided injection.  Procedure  note 97110; 15 minutes spent for Therapeutic exercises as stated in above notes.  This included exercises focusing on stretching, strengthening, with significant focus on eccentric aspects. Shoulder Exercises that included:  Basic scapular stabilization to include adduction and depression of scapula Scaption, focusing on proper movement and good control Internal and External rotation utilizing a theraband, with elbow tucked at side entire time Rows with theraband    Proper technique shown and discussed handout in great detail with ATC.  All questions were discussed and answered.     Impression and Recommendations:     This case required medical decision making of moderate complexity.      Note: This dictation was prepared with Dragon dictation along with smaller phrase technology. Any transcriptional errors that result from this process are unintentional.

## 2016-06-23 NOTE — Patient Instructions (Signed)
Good to see you.  Ice 20 minutes 2 times daily. Usually after activity and before bed. Exercises 3 times a week.  Keep hands within peripheral vision pennsaid pinkie amount topically 2 times daily as needed.  Injected the shoulder today  See me again in 4 weeks If not perfect

## 2016-07-06 ENCOUNTER — Telehealth: Payer: Self-pay | Admitting: Endocrinology

## 2016-07-06 NOTE — Telephone Encounter (Signed)
Patient son died last night and she is not doing well, and would like something to take to calm her nerves. Please advise

## 2016-07-06 NOTE — Telephone Encounter (Signed)
Ov today or tomorrow 

## 2016-07-06 NOTE — Telephone Encounter (Signed)
I contacted the patient and advised of message. She voiced understanding, but stated at this time she could not schedule an appointment. She stated she would call back when she could to schedule her appointment.

## 2016-07-06 NOTE — Telephone Encounter (Signed)
See message and please advise, Thanks!  

## 2016-07-21 ENCOUNTER — Ambulatory Visit: Payer: Medicare Other | Admitting: Family Medicine

## 2016-10-26 ENCOUNTER — Telehealth: Payer: Self-pay | Admitting: Endocrinology

## 2016-10-26 NOTE — Telephone Encounter (Signed)
Patient called to advise that she was stung by a bee this weekend- she thinks it was a wasp.   She states that the site is on her stomach and has welped to the size of a 50 cent piece. Also states that there is continuous itching at the site.   She is seeking advice. Please call patient on the mobile # listed

## 2016-10-26 NOTE — Telephone Encounter (Signed)
Patient called and would like to switch from Dr.Ellison to Dr.Duncan.  Patient said she needs a PCP.  Patient husband, Jonny RuizJohn, is a patient of Dr.Duncan's.  Can patient switch to Dr.Duncan?

## 2016-10-26 NOTE — Telephone Encounter (Signed)
OK 

## 2016-10-26 NOTE — Telephone Encounter (Signed)
Please advise urgent care

## 2016-10-27 NOTE — Telephone Encounter (Signed)
Larita FifeLynn scheduled appointments for patient.

## 2016-10-27 NOTE — Telephone Encounter (Signed)
Called patient but had to leave VM telling her that its best to go to urgent care for something that immediate.

## 2016-10-27 NOTE — Telephone Encounter (Signed)
Okay with me, needs AMW/CPE/OV with me when possible.  Thanks.

## 2016-12-02 ENCOUNTER — Encounter: Payer: Medicare Other | Admitting: Endocrinology

## 2016-12-08 ENCOUNTER — Ambulatory Visit (INDEPENDENT_AMBULATORY_CARE_PROVIDER_SITE_OTHER): Payer: Medicare Other

## 2016-12-08 VITALS — BP 124/78 | HR 71 | Temp 97.5°F | Ht 60.0 in | Wt 140.2 lb

## 2016-12-08 DIAGNOSIS — M818 Other osteoporosis without current pathological fracture: Secondary | ICD-10-CM

## 2016-12-08 DIAGNOSIS — I10 Essential (primary) hypertension: Secondary | ICD-10-CM | POA: Diagnosis not present

## 2016-12-08 DIAGNOSIS — Z23 Encounter for immunization: Secondary | ICD-10-CM

## 2016-12-08 DIAGNOSIS — Z0000 Encounter for general adult medical examination without abnormal findings: Secondary | ICD-10-CM | POA: Diagnosis not present

## 2016-12-08 DIAGNOSIS — R946 Abnormal results of thyroid function studies: Secondary | ICD-10-CM

## 2016-12-08 DIAGNOSIS — E7800 Pure hypercholesterolemia, unspecified: Secondary | ICD-10-CM | POA: Diagnosis not present

## 2016-12-08 DIAGNOSIS — D509 Iron deficiency anemia, unspecified: Secondary | ICD-10-CM

## 2016-12-08 DIAGNOSIS — R7989 Other specified abnormal findings of blood chemistry: Secondary | ICD-10-CM

## 2016-12-08 LAB — LIPID PANEL
CHOL/HDL RATIO: 4
Cholesterol: 255 mg/dL — ABNORMAL HIGH (ref 0–200)
HDL: 62.7 mg/dL (ref >39.00)
LDL Cholesterol: 168 mg/dL — ABNORMAL HIGH (ref 0–99)
NonHDL: 192.04
TRIGLYCERIDES: 119 mg/dL (ref 0.0–149.0)
VLDL: 23.8 mg/dL (ref 0.0–40.0)

## 2016-12-08 LAB — CBC WITH DIFFERENTIAL/PLATELET
BASOS ABS: 0 10*3/uL (ref 0.0–0.1)
Basophils Relative: 0.4 % (ref 0.0–3.0)
Eosinophils Absolute: 0.1 10*3/uL (ref 0.0–0.7)
Eosinophils Relative: 1.3 % (ref 0.0–5.0)
HEMATOCRIT: 38 % (ref 36.0–46.0)
HEMOGLOBIN: 12.8 g/dL (ref 12.0–15.0)
LYMPHS PCT: 24.3 % (ref 12.0–46.0)
Lymphs Abs: 1.7 10*3/uL (ref 0.7–4.0)
MCHC: 33.6 g/dL (ref 30.0–36.0)
MCV: 90 fl (ref 78.0–100.0)
MONOS PCT: 6.6 % (ref 3.0–12.0)
Monocytes Absolute: 0.5 10*3/uL (ref 0.1–1.0)
NEUTROS ABS: 4.8 10*3/uL (ref 1.4–7.7)
Neutrophils Relative %: 67.4 % (ref 43.0–77.0)
PLATELETS: 223 10*3/uL (ref 150.0–400.0)
RBC: 4.23 Mil/uL (ref 3.87–5.11)
RDW: 14.5 % (ref 11.5–15.5)
WBC: 7.1 10*3/uL (ref 4.0–10.5)

## 2016-12-08 LAB — VITAMIN D 25 HYDROXY (VIT D DEFICIENCY, FRACTURES): VITD: 51.72 ng/mL (ref 30.00–100.00)

## 2016-12-08 LAB — COMPREHENSIVE METABOLIC PANEL
ALBUMIN: 4.3 g/dL (ref 3.5–5.2)
ALT: 15 U/L (ref 0–35)
AST: 21 U/L (ref 0–37)
Alkaline Phosphatase: 42 U/L (ref 39–117)
BUN: 12 mg/dL (ref 6–23)
CALCIUM: 9.9 mg/dL (ref 8.4–10.5)
CO2: 30 mEq/L (ref 19–32)
CREATININE: 0.8 mg/dL (ref 0.40–1.20)
Chloride: 107 mEq/L (ref 96–112)
GFR: 90.13 mL/min (ref >60.00)
Glucose, Bld: 96 mg/dL (ref 70–99)
POTASSIUM: 4.1 meq/L (ref 3.5–5.1)
Sodium: 141 mEq/L (ref 135–145)
Total Bilirubin: 0.5 mg/dL (ref 0.2–1.2)
Total Protein: 7 g/dL (ref 6.0–8.3)

## 2016-12-08 LAB — IBC PANEL
IRON: 94 ug/dL (ref 42–145)
Saturation Ratios: 27.1 % (ref 20.0–50.0)
TRANSFERRIN: 248 mg/dL (ref 212.0–360.0)

## 2016-12-08 LAB — TSH: TSH: 0.93 u[IU]/mL (ref 0.35–4.50)

## 2016-12-08 NOTE — Progress Notes (Signed)
Subjective:   Tanya Espinoza is a 76 y.o. female who presents for Medicare Annual (Subsequent) preventive examination.  Review of Systems:  N/A Cardiac Risk Factors include: advanced age (>18men, >77 women);dyslipidemia;hypertension     Objective:     Vitals: BP 124/78 (BP Location: Right Arm, Patient Position: Sitting, Cuff Size: Normal)   Pulse 71   Temp (!) 97.5 F (36.4 C) (Oral)   Ht 5' (1.524 m) Comment: no shoes  Wt 140 lb 4 oz (63.6 kg)   SpO2 98%   BMI 27.39 kg/m   Body mass index is 27.39 kg/m.   Tobacco History  Smoking Status  . Never Smoker  Smokeless Tobacco  . Never Used     Counseling given: No   Past Medical History:  Diagnosis Date  . ASYMPTOMATIC POSTMENOPAUSAL STATUS 08/23/2007  . BRADYCARDIA 08/23/2007  . DEPRESSION 11/30/2006  . HYPERLIPIDEMIA 11/30/2006  . HYPERTENSION 11/30/2006  . OSTEOPOROSIS 11/30/2006  . PSORIASIS 11/30/2006  . UTI'S, HX OF 11/30/2006   Past Surgical History:  Procedure Laterality Date  . EYE SURGERY     cataracts bil.   Family History  Problem Relation Age of Onset  . Cancer Sister        Breast Cancer  . Colon cancer Neg Hx    History  Sexual Activity  . Sexual activity: Yes  . Birth control/ protection: None    Outpatient Encounter Prescriptions as of 12/08/2016  Medication Sig  . aspirin EC 81 MG tablet Take 81 mg by mouth daily.  . Calcium Carb-Cholecalciferol (CALCIUM + D3 PO) Take 1 tablet by mouth daily.  Marland Kitchen escitalopram (LEXAPRO) 10 MG tablet TAKE 1 TABLET DAILY  . furosemide (LASIX) 20 MG tablet TAKE 1 TABLET DAILY  . GLUCOSAMINE HCL PO Take 2 tablets by mouth daily.  . Multiple Vitamins-Minerals (CENTRUM SILVER PO) Take 1 tablet by mouth daily.  . Omega-3 Fatty Acids (FISH OIL PO) Take 1 tablet by mouth daily.  Marland Kitchen trimethoprim (TRIMPEX) 100 MG tablet   . TURMERIC PO Take 1 tablet by mouth daily.  . [DISCONTINUED] EPINEPHrine (EPIPEN) 0.3 mg/0.3 mL DEVI Inject 0.3 mLs (0.3 mg total) into the  muscle once. (Patient not taking: Reported on 12/08/2016)  . [DISCONTINUED] rosuvastatin (CRESTOR) 5 MG tablet Take 1 tablet (5 mg total) by mouth daily. (Patient not taking: Reported on 12/08/2016)  . [DISCONTINUED] traMADol (ULTRAM) 50 MG tablet TAKE 1 TABLET BY MOUTH EVERY 8 HOURS AS NEEDED (Patient not taking: Reported on 12/08/2016)   No facility-administered encounter medications on file as of 12/08/2016.     Activities of Daily Living In your present state of health, do you have any difficulty performing the following activities: 12/08/2016  Hearing? N  Vision? N  Difficulty concentrating or making decisions? N  Walking or climbing stairs? N  Dressing or bathing? N  Doing errands, shopping? N  Preparing Food and eating ? N  Using the Toilet? N  In the past six months, have you accidently leaked urine? N  Do you have problems with loss of bowel control? N  Managing your Medications? N  Managing your Finances? N  Housekeeping or managing your Housekeeping? N  Some recent data might be hidden    Patient Care Team: Joaquim Nam, MD as PCP - General (Family Medicine) Ernesto Rutherford, MD (Ophthalmology) McDiarmid, Leighton Roach, MD as Attending Physician (Family Medicine)    Assessment:     Hearing Screening              Right ear:   40 40 40  0    Left ear:   40 0 0  0    Vision Screening Comments: Last vision exam in Jan 2018 with Dr. Dione Booze   Exercise Activities and Dietary recommendations Current Exercise Habits: The patient does not participate in regular exercise at present, Exercise limited by: None identified  Goals    . Increase physical activity          When schedule permits, I will resume walking for 3 miles 3 days per week.       Fall Risk Fall Risk  12/08/2016  Falls in the past year? No   Depression Screen PHQ 2/9 Scores 12/08/2016  PHQ - 2 Score 1  PHQ- 9 Score 2     Cognitive Function MMSE - Mini Mental  State Exam 12/08/2016  Orientation to time 5  Orientation to Place 5  Registration 3  Attention/ Calculation 0  Recall 3  Language- name 2 objects 0  Language- repeat 1  Language- follow 3 step command 3  Language- read & follow direction 0  Write a sentence 0  Copy design 0  Total score 20       PLEASE NOTE: A Mini-Cog screen was completed. Maximum score is 20. A value of 0 denotes this part of Folstein MMSE was not completed or the patient failed this part of the Mini-Cog screening.   Mini-Cog Screening Orientation to Time - Max 5 pts Orientation to Place - Max 5 pts Registration - Max 3 pts Recall - Max 3 pts Language Repeat - Max 1 pts Language Follow 3 Step Command - Max 3 pts   Immunization History  Administered Date(s) Administered  . H1N1 02/21/2008  . Influenza Split 12/22/2010  . Influenza Whole 12/27/2007  . Influenza,inj,Quad PF,6+ Mos 11/24/2013, 11/27/2014, 12/03/2015, 12/08/2016  . Pneumococcal Conjugate-13 09/18/2008  . Pneumococcal Polysaccharide-23 10/03/2010  . Td 07/15/2003  . Tdap 11/24/2013  . Zoster 10/15/2011   Screening Tests Health Maintenance  Topic Date Due  . MAMMOGRAM  02/02/2018  . TETANUS/TDAP  11/25/2023  . COLONOSCOPY  01/17/2024  . INFLUENZA VACCINE  Completed  . DEXA SCAN  Completed  . PNA vac Low Risk Adult  Completed      Plan:   I have personally reviewed and addressed the Medicare Annual Wellness questionnaire and have noted the following in the patient's chart:  A. Medical and social history B. Use of alcohol, tobacco or illicit drugs  C. Current medications and supplements D. Functional ability and status E.  Nutritional status F.  Physical activity G. Advance directives H. List of other physicians I.  Hospitalizations, surgeries, and ER visits in previous 12 months J.  Vitals K. Screenings to include hearing, vision, cognitive, depression L. Referrals and appointments - none  In addition, I have reviewed and  discussed with patient certain preventive protocols, quality metrics, and best practice recommendations. A written personalized care plan for preventive services as well as general preventive health recommendations were provided to patient.  See attached scanned questionnaire for additional information.   Signed,   Randa Evens, MHA, BS, LPN Health Coach

## 2016-12-08 NOTE — Progress Notes (Signed)
PCP notes:   Health maintenance:  Flu vaccine - administered  Abnormal screenings:   Depression score: 2 Hearing - failed  Hearing Screening             Right ear:   40 40 40  0    Left ear:   40 0 0  0      Patient concerns:   Urinalysis - PCP please address at next appt; pt has had annual UA completed with previous provider  Nurse concerns:  None  Next PCP appt:   12/11/16 @ 0945

## 2016-12-08 NOTE — Progress Notes (Signed)
Pre visit review using our clinic review tool, if applicable. No additional management support is needed unless otherwise documented below in the visit note. 

## 2016-12-08 NOTE — Patient Instructions (Signed)
Tanya Espinoza , Thank you for taking time to come for your Medicare Wellness Visit. I appreciate your ongoing commitment to your health goals. Please review the following plan we discussed and let me know if I can assist you in the future.   These are the goals we discussed: Goals    . Increase physical activity          When schedule permits, I will resume walking for 3 miles 3 days per week.        This is a list of the screening recommended for you and due dates:  Health Maintenance  Topic Date Due  . Mammogram  02/02/2018  . Tetanus Vaccine  11/25/2023  . Colon Cancer Screening  01/17/2024  . Flu Shot  Completed  . DEXA scan (bone density measurement)  Completed  . Pneumonia vaccines  Completed   Preventive Care for Adults  A healthy lifestyle and preventive care can promote health and wellness. Preventive health guidelines for adults include the following key practices.  . A routine yearly physical is a good way to check with your health care provider about your health and preventive screening. It is a chance to share any concerns and updates on your health and to receive a thorough exam.  . Visit your dentist for a routine exam and preventive care every 6 months. Brush your teeth twice a day and floss once a day. Good oral hygiene prevents tooth decay and gum disease.  . The frequency of eye exams is based on your age, health, family medical history, use  of contact lenses, and other factors. Follow your health care provider's ecommendations for frequency of eye exams.  . Eat a healthy diet. Foods like vegetables, fruits, whole grains, low-fat dairy products, and lean protein foods contain the nutrients you need without too many calories. Decrease your intake of foods high in solid fats, added sugars, and salt. Eat the right amount of calories for you. Get information about a proper diet from your health care provider, if necessary.  . Regular physical exercise is one of the  most important things you can do for your health. Most adults should get at least 150 minutes of moderate-intensity exercise (any activity that increases your heart rate and causes you to sweat) each week. In addition, most adults need muscle-strengthening exercises on 2 or more days a week.  Silver Sneakers may be a benefit available to you. To determine eligibility, you may visit the website: www.silversneakers.com or contact program at 706-720-4040 Mon-Fri between 8AM-8PM.   . Maintain a healthy weight. The body mass index (BMI) is a screening tool to identify possible weight problems. It provides an estimate of body fat based on height and weight. Your health care provider can find your BMI and can help you achieve or maintain a healthy weight.   For adults 20 years and older: ? A BMI below 18.5 is considered underweight. ? A BMI of 18.5 to 24.9 is normal. ? A BMI of 25 to 29.9 is considered overweight. ? A BMI of 30 and above is considered obese.   . Maintain normal blood lipids and cholesterol levels by exercising and minimizing your intake of saturated fat. Eat a balanced diet with plenty of fruit and vegetables. Blood tests for lipids and cholesterol should begin at age 24 and be repeated every 5 years. If your lipid or cholesterol levels are high, you are over 50, or you are at high risk for heart disease, you may  need your cholesterol levels checked more frequently. Ongoing high lipid and cholesterol levels should be treated with medicines if diet and exercise are not working.  . If you smoke, find out from your health care provider how to quit. If you do not use tobacco, please do not start.  . If you choose to drink alcohol, please do not consume more than 2 drinks per day. One drink is considered to be 12 ounces (355 mL) of beer, 5 ounces (148 mL) of wine, or 1.5 ounces (44 mL) of liquor.  . If you are 44-3 years old, ask your health care provider if you should take aspirin to  prevent strokes.  . Use sunscreen. Apply sunscreen liberally and repeatedly throughout the day. You should seek shade when your shadow is shorter than you. Protect yourself by wearing long sleeves, pants, a wide-brimmed hat, and sunglasses year round, whenever you are outdoors.  . Once a month, do a whole body skin exam, using a mirror to look at the skin on your back. Tell your health care provider of new moles, moles that have irregular borders, moles that are larger than a pencil eraser, or moles that have changed in shape or color.

## 2016-12-09 NOTE — Progress Notes (Signed)
   Subjective:    Patient ID: Tanya Espinoza, female    DOB: 20-Jul-1940, 76 y.o.   MRN: [id number]  HPI I reviewed health advisor's note, was available for consultation, and agree with documentation and plan.    Review of Systems     Objective:   Physical Exam        Assessment & Plan:

## 2016-12-11 ENCOUNTER — Ambulatory Visit: Payer: Medicare Other | Admitting: Family Medicine

## 2016-12-24 ENCOUNTER — Ambulatory Visit (INDEPENDENT_AMBULATORY_CARE_PROVIDER_SITE_OTHER): Payer: Medicare Other | Admitting: Family Medicine

## 2016-12-24 ENCOUNTER — Encounter: Payer: Self-pay | Admitting: Family Medicine

## 2016-12-24 VITALS — BP 124/78 | HR 71 | Temp 97.5°F | Ht 60.0 in | Wt 140.2 lb

## 2016-12-24 DIAGNOSIS — Z7189 Other specified counseling: Secondary | ICD-10-CM

## 2016-12-24 DIAGNOSIS — L989 Disorder of the skin and subcutaneous tissue, unspecified: Secondary | ICD-10-CM | POA: Diagnosis not present

## 2016-12-24 DIAGNOSIS — E785 Hyperlipidemia, unspecified: Secondary | ICD-10-CM

## 2016-12-24 DIAGNOSIS — R609 Edema, unspecified: Secondary | ICD-10-CM | POA: Diagnosis not present

## 2016-12-24 DIAGNOSIS — Z0000 Encounter for general adult medical examination without abnormal findings: Secondary | ICD-10-CM

## 2016-12-24 DIAGNOSIS — F339 Major depressive disorder, recurrent, unspecified: Secondary | ICD-10-CM | POA: Diagnosis not present

## 2016-12-24 MED ORDER — ESCITALOPRAM OXALATE 10 MG PO TABS: 10.0000 mg | ORAL_TABLET | Freq: Every day | ORAL | 3 refills | Status: DC

## 2016-12-24 MED ORDER — ROSUVASTATIN CALCIUM 20 MG PO TABS: 20.0000 mg | ORAL_TABLET | Freq: Every day | ORAL | 3 refills | Status: DC

## 2016-12-24 MED ORDER — FUROSEMIDE 20 MG PO TABS
20.0000 mg | ORAL_TABLET | Freq: Every day | ORAL | 3 refills | Status: DC
Start: 2016-12-24 — End: 2017-12-27

## 2016-12-24 NOTE — Patient Instructions (Signed)
Add back the crestor.  If you have aches on the medicine, then stop it and let me know.  Recheck labs in about 2 months, fasting.  You'll need a lab appointment.  Take care.  Glad to see you.  Update me as needed.

## 2016-12-24 NOTE — Progress Notes (Signed)
Depression around the time of retirement from [employer] in 08/11/2006.  Compliant with SSRI.  No ADE on med.  1 son alive as of 08/10/16 (her other son died in 2016-08-10).  I offered my condolences.  She is trying to adjust to this change in life, with appropriate grieving. No suicidal or homicidal intent.  Some BLE, taking lasix at baseline.  No ADE on med.  She does have slightly more swelling in the left leg than the right, but this is her baseline.  H/o HLD.  Had been off crestor.  No ADE on med prev.   Labs discussed with patient.  Declined hearing aids.   D/w pt about hearing screen.   D/w pt about checking u/a- no sx so no testing needed.   Mammogram due later this year.   DXA wnl Aug 11, 2014.  Okay to defer as of Aug 10, 2016.   Colonoscopy 2015 Pap not due.   Diet and execrise d/w pt.  Encouraged both.  Her situation changed after her son's death.  D/w pt.   Advance directive d/w pt.  Husband designated if patient were incapacitated.    She has occ bruise like lesions that will come up then self resolve.  No pattern.  Usually on the ext.  Not ttp.    PMH and SH reviewed  ROS: Per HPI unless specifically indicated in ROS section   Meds, vitals, and allergies reviewed.   GEN: nad, alert and oriented HEENT: mucous membranes moist NECK: supple w/o LA CV: rrr. PULM: ctab, no inc wob ABD: soft, +bs EXT: no edema SKIN: no acute rash but small lesion on R forearm, 3mm across, that looks like a small bruise.

## 2016-12-25 DIAGNOSIS — Z0000 Encounter for general adult medical examination without abnormal findings: Secondary | ICD-10-CM | POA: Insufficient documentation

## 2016-12-25 DIAGNOSIS — L989 Disorder of the skin and subcutaneous tissue, unspecified: Secondary | ICD-10-CM | POA: Insufficient documentation

## 2016-12-25 DIAGNOSIS — Z7189 Other specified counseling: Secondary | ICD-10-CM | POA: Insufficient documentation

## 2016-12-25 NOTE — Assessment & Plan Note (Signed)
  She has occ bruise like lesions that will come up then self resolve.  No pattern.  Usually on the ext.  Not ttp.  Her platelets are normal. His long as the lesions resolve I would not intervene. Discussed with patient, she agrees.

## 2016-12-25 NOTE — Assessment & Plan Note (Signed)
Advance directive- d/w pt. Husband designated if patient were incapacitated.  

## 2016-12-25 NOTE — Assessment & Plan Note (Signed)
Declined hearing aids.   D/w pt about hearing screen.   D/w pt about checking u/a- no sx so no testing needed.   Mammogram due later this year.   DXA wnl 2016.  Okay to defer as of 2018.   Colonoscopy 2015 Pap not due.   Diet and execrise d/w pt.  Encouraged both.  Her situation changed after her son's death.  D/w pt.   Advance directive d/w pt.  Husband designated if patient were incapacitated.

## 2016-12-25 NOTE — Assessment & Plan Note (Signed)
History of, with noted grief this year related to the death of her son. No suicidal or homicidal intent. She is trying to work through the changes with her son's death. Continue SSRI. She agrees.

## 2016-12-25 NOTE — Assessment & Plan Note (Signed)
Okay to continue Lasix at baseline. Labs discussed with patient.

## 2016-12-25 NOTE — Assessment & Plan Note (Addendum)
Lipids elevated. Discussed with patient about cardiovascular risk and subsequent reduction. Continue work on diet and exercise. Restart Crestor 20 mg a day and recheck labs later in the year. Rationale discussed with patient. Routine cautions given. She agrees. >25 minutes spent in face to face time with patient, >50% spent in counselling or coordination of care.

## 2016-12-29 ENCOUNTER — Other Ambulatory Visit: Payer: Self-pay | Admitting: Family Medicine

## 2016-12-29 DIAGNOSIS — Z1231 Encounter for screening mammogram for malignant neoplasm of breast: Secondary | ICD-10-CM

## 2017-02-03 ENCOUNTER — Ambulatory Visit: Payer: Medicare Other

## 2017-02-22 ENCOUNTER — Ambulatory Visit: Payer: Medicare Other

## 2017-02-23 ENCOUNTER — Other Ambulatory Visit: Payer: Medicare Other

## 2017-02-23 ENCOUNTER — Ambulatory Visit
Admission: RE | Admit: 2017-02-23 | Discharge: 2017-02-23 | Disposition: A | Discharge status: Home / Self Care | Payer: Medicare Other | Source: Ambulatory Visit | Attending: Family Medicine | Admitting: Family Medicine

## 2017-02-23 DIAGNOSIS — Z1231 Encounter for screening mammogram for malignant neoplasm of breast: Secondary | ICD-10-CM

## 2017-02-25 ENCOUNTER — Encounter: Payer: Self-pay | Admitting: *Deleted

## 2017-03-02 ENCOUNTER — Other Ambulatory Visit (INDEPENDENT_AMBULATORY_CARE_PROVIDER_SITE_OTHER): Payer: Medicare Other

## 2017-03-02 DIAGNOSIS — E785 Hyperlipidemia, unspecified: Secondary | ICD-10-CM | POA: Diagnosis not present

## 2017-03-02 LAB — LIPID PANEL
Cholesterol: 155 mg/dL (ref 0–200)
HDL: 67.3 mg/dL (ref >39.00)
LDL CALC: 72 mg/dL (ref 0–99)
NONHDL: 87.23
Total CHOL/HDL Ratio: 2
Triglycerides: 74 mg/dL (ref 0.0–149.0)
VLDL: 14.8 mg/dL (ref 0.0–40.0)

## 2017-03-02 LAB — HEPATIC FUNCTION PANEL
ALBUMIN: 4.1 g/dL (ref 3.5–5.2)
ALT: 20 U/L (ref 0–35)
AST: 26 U/L (ref 0–37)
Alkaline Phosphatase: 42 U/L (ref 39–117)
BILIRUBIN DIRECT: 0.1 mg/dL (ref 0.0–0.3)
TOTAL PROTEIN: 6.9 g/dL (ref 6.0–8.3)
Total Bilirubin: 0.6 mg/dL (ref 0.2–1.2)

## 2017-04-19 ENCOUNTER — Telehealth: Payer: Self-pay | Admitting: Family Medicine

## 2017-04-19 NOTE — Telephone Encounter (Signed)
Pt called requesting information on the Shingles injection the Shingrix. She would like her pcp  to give her a call her back to see if she needs to get it.

## 2017-12-06 ENCOUNTER — Other Ambulatory Visit: Payer: Self-pay | Admitting: Family Medicine

## 2017-12-14 ENCOUNTER — Ambulatory Visit (INDEPENDENT_AMBULATORY_CARE_PROVIDER_SITE_OTHER): Payer: Medicare Other

## 2017-12-14 ENCOUNTER — Other Ambulatory Visit: Payer: Self-pay | Admitting: Family Medicine

## 2017-12-14 ENCOUNTER — Ambulatory Visit: Payer: Medicare Other

## 2017-12-14 VITALS — BP 118/80 | HR 54 | Temp 98.5°F | Ht 60.0 in | Wt 141.8 lb

## 2017-12-14 DIAGNOSIS — E785 Hyperlipidemia, unspecified: Secondary | ICD-10-CM

## 2017-12-14 DIAGNOSIS — R7989 Other specified abnormal findings of blood chemistry: Secondary | ICD-10-CM

## 2017-12-14 DIAGNOSIS — Z0000 Encounter for general adult medical examination without abnormal findings: Secondary | ICD-10-CM

## 2017-12-14 DIAGNOSIS — D509 Iron deficiency anemia, unspecified: Secondary | ICD-10-CM | POA: Diagnosis not present

## 2017-12-14 DIAGNOSIS — Z23 Encounter for immunization: Secondary | ICD-10-CM

## 2017-12-14 LAB — CBC WITH DIFFERENTIAL/PLATELET
BASOS ABS: 0 10*3/uL (ref 0.0–0.1)
Basophils Relative: 0.6 % (ref 0.0–3.0)
EOS ABS: 0.1 10*3/uL (ref 0.0–0.7)
Eosinophils Relative: 2.2 % (ref 0.0–5.0)
HEMATOCRIT: 37.1 % (ref 36.0–46.0)
HEMOGLOBIN: 12.5 g/dL (ref 12.0–15.0)
LYMPHS PCT: 29.8 % (ref 12.0–46.0)
Lymphs Abs: 2 10*3/uL (ref 0.7–4.0)
MCHC: 33.7 g/dL (ref 30.0–36.0)
MCV: 87.8 fl (ref 78.0–100.0)
MONOS PCT: 6 % (ref 3.0–12.0)
Monocytes Absolute: 0.4 10*3/uL (ref 0.1–1.0)
Neutro Abs: 4.2 10*3/uL (ref 1.4–7.7)
Neutrophils Relative %: 61.4 % (ref 43.0–77.0)
PLATELETS: 195 10*3/uL (ref 150.0–400.0)
RBC: 4.23 Mil/uL (ref 3.87–5.11)
RDW: 14.6 % (ref 11.5–15.5)
WBC: 6.8 10*3/uL (ref 4.0–10.5)

## 2017-12-14 LAB — LIPID PANEL
CHOLESTEROL: 158 mg/dL (ref 0–200)
HDL: 67.3 mg/dL (ref >39.00)
LDL Cholesterol: 74 mg/dL (ref 0–99)
NonHDL: 90.51
TRIGLYCERIDES: 81 mg/dL (ref 0.0–149.0)
Total CHOL/HDL Ratio: 2
VLDL: 16.2 mg/dL (ref 0.0–40.0)

## 2017-12-14 LAB — COMPREHENSIVE METABOLIC PANEL
ALBUMIN: 4.1 g/dL (ref 3.5–5.2)
ALT: 17 U/L (ref 0–35)
AST: 22 U/L (ref 0–37)
Alkaline Phosphatase: 44 U/L (ref 39–117)
BUN: 13 mg/dL (ref 6–23)
CALCIUM: 9.7 mg/dL (ref 8.4–10.5)
CO2: 31 mEq/L (ref 19–32)
CREATININE: 0.92 mg/dL (ref 0.40–1.20)
Chloride: 107 mEq/L (ref 96–112)
GFR: 76.49 mL/min (ref >60.00)
Glucose, Bld: 92 mg/dL (ref 70–99)
Potassium: 4.3 mEq/L (ref 3.5–5.1)
Sodium: 142 mEq/L (ref 135–145)
Total Bilirubin: 0.5 mg/dL (ref 0.2–1.2)
Total Protein: 6.7 g/dL (ref 6.0–8.3)

## 2017-12-14 LAB — TSH: TSH: 0.66 u[IU]/mL (ref 0.35–4.50)

## 2017-12-14 NOTE — Patient Instructions (Signed)
Tanya Espinoza , Thank you for taking time to come for your Medicare Wellness Visit. I appreciate your ongoing commitment to your health goals. Please review the following plan we discussed and let me know if I can assist you in the future.   These are the goals we discussed: Goals    . Increase physical activity     Starting 12/14/2017, I will continue to walk at least 45 minutes 3 days per week.        This is a list of the screening recommended for you and due dates:  Health Maintenance  Topic Date Due  . Tetanus Vaccine  11/25/2023  . Colon Cancer Screening  01/17/2024  . Flu Shot  Completed  . DEXA scan (bone density measurement)  Completed  . Pneumonia vaccines  Completed   Preventive Care for Adults  A healthy lifestyle and preventive care can promote health and wellness. Preventive health guidelines for adults include the following key practices.  . A routine yearly physical is a good way to check with your health care provider about your health and preventive screening. It is a chance to share any concerns and updates on your health and to receive a thorough exam.  . Visit your dentist for a routine exam and preventive care every 6 months. Brush your teeth twice a day and floss once a day. Good oral hygiene prevents tooth decay and gum disease.  . The frequency of eye exams is based on your age, health, family medical history, use  of contact lenses, and other factors. Follow your health care provider's recommendations for frequency of eye exams.  . Eat a healthy diet. Foods like vegetables, fruits, whole grains, low-fat dairy products, and lean protein foods contain the nutrients you need without too many calories. Decrease your intake of foods high in solid fats, added sugars, and salt. Eat the right amount of calories for you. Get information about a proper diet from your health care provider, if necessary.  . Regular physical exercise is one of the most important things you  can do for your health. Most adults should get at least 150 minutes of moderate-intensity exercise (any activity that increases your heart rate and causes you to sweat) each week. In addition, most adults need muscle-strengthening exercises on 2 or more days a week.  Silver Sneakers may be a benefit available to you. To determine eligibility, you may visit the website: www.silversneakers.com or contact program at (901) 307-5508 Mon-Fri between 8AM-8PM.   . Maintain a healthy weight. The body mass index (BMI) is a screening tool to identify possible weight problems. It provides an estimate of body fat based on height and weight. Your health care provider can find your BMI and can help you achieve or maintain a healthy weight.   For adults 20 years and older: ? A BMI below 18.5 is considered underweight. ? A BMI of 18.5 to 24.9 is normal. ? A BMI of 25 to 29.9 is considered overweight. ? A BMI of 30 and above is considered obese.   . Maintain normal blood lipids and cholesterol levels by exercising and minimizing your intake of saturated fat. Eat a balanced diet with plenty of fruit and vegetables. Blood tests for lipids and cholesterol should begin at age 58 and be repeated every 5 years. If your lipid or cholesterol levels are high, you are over 50, or you are at high risk for heart disease, you may need your cholesterol levels checked more frequently. Ongoing high  lipid and cholesterol levels should be treated with medicines if diet and exercise are not working.  . If you smoke, find out from your health care provider how to quit. If you do not use tobacco, please do not start.  . If you choose to drink alcohol, please do not consume more than 2 drinks per day. One drink is considered to be 12 ounces (355 mL) of beer, 5 ounces (148 mL) of wine, or 1.5 ounces (44 mL) of liquor.  . If you are 60-42 years old, ask your health care provider if you should take aspirin to prevent strokes.  . Use  sunscreen. Apply sunscreen liberally and repeatedly throughout the day. You should seek shade when your shadow is shorter than you. Protect yourself by wearing long sleeves, pants, a wide-brimmed hat, and sunglasses year round, whenever you are outdoors.  . Once a month, do a whole body skin exam, using a mirror to look at the skin on your back. Tell your health care provider of new moles, moles that have irregular borders, moles that are larger than a pencil eraser, or moles that have changed in shape or color.

## 2017-12-14 NOTE — Progress Notes (Signed)
Subjective:   Tanya Espinoza is a 77 y.o. female who presents for Medicare Annual (Subsequent) preventive examination.  Review of Systems:  N/A Cardiac Risk Factors include: advanced age (>33men, >45 women);dyslipidemia;hypertension     Objective:     Vitals: BP 118/80 (BP Location: Left Arm, Patient Position: Sitting, Cuff Size: Normal)   Pulse (!) 54   Temp 98.5 F (36.9 C) (Oral)   Ht 5' (1.524 m) Comment: no shoes  Wt 141 lb 12 oz (64.3 kg)   SpO2 99%   BMI 27.68 kg/m   Body mass index is 27.68 kg/m.  Advanced Directives 12/14/2017 12/08/2016 12/03/2015 07/26/2015 11/27/2014 01/01/2014  Does Patient Have a Medical Advance Directive? No Yes No No No No  Type of Advance Directive - Living will - - - -  Would patient like information on creating a medical advance directive? No - Patient declined - - - - -    Tobacco Social History   Tobacco Use  Smoking Status Never Smoker  Smokeless Tobacco Never Used     Counseling given: No   Clinical Intake:  Pre-visit preparation completed: Yes  Pain : No/denies pain Pain Score: 0-No pain     Nutritional Status: BMI of 19-24  Normal Nutritional Risks: None Diabetes: No  How often do you need to have someone help you when you read instructions, pamphlets, or other written materials from your doctor or pharmacy?: 1 - Never What is the last grade level you completed in school?: 12th + 4 yrs college  Interpreter Needed?: No  Comments: pt lives with spouse  Information entered by :: LPinson, LPN  Past Medical History:  Diagnosis Date  . ASYMPTOMATIC POSTMENOPAUSAL STATUS 08/23/2007  . BRADYCARDIA 08/23/2007  . DEPRESSION 11/30/2006  . HYPERLIPIDEMIA 11/30/2006  . HYPERTENSION 11/30/2006  . OSTEOPOROSIS 11/30/2006  . PSORIASIS 11/30/2006  . UTI'S, HX OF 11/30/2006   Past Surgical History:  Procedure Laterality Date  . EYE SURGERY     cataracts bil.   Family History  Problem Relation Age of Onset  . Cancer Sister          Breast Cancer  . Stroke Mother   . Breast cancer Sister   . Colon cancer Neg Hx    Social History   Socioeconomic History  . Marital status: Married    Spouse name: Not on file  . Number of children: Not on file  . Years of education: Not on file  . Highest education level: Not on file  Occupational History  . Occupation: Sales executive  . Financial resource strain: Not on file  . Food insecurity:    Worry: Not on file    Inability: Not on file  . Transportation needs:    Medical: Not on file    Non-medical: Not on file  Tobacco Use  . Smoking status: Never Smoker  . Smokeless tobacco: Never Used  Substance and Sexual Activity  . Alcohol use: No  . Drug use: No  . Sexual activity: Yes    Birth control/protection: None  Lifestyle  . Physical activity:    Days per week: Not on file    Minutes per session: Not on file  . Stress: Not on file  Relationships  . Social connections:    Talks on phone: Not on file    Gets together: Not on file    Attends religious service: Not on file    Active member of club or organization: Not on file  Attends meetings of clubs or organizations: Not on file    Relationship status: Not on file  Other Topics Concern  . Not on file  Social History Narrative   From [place]   Retired from [employer]   Married 07/22/69   1 son alive as of 07/22/2016 (her other son died in 2016/07/22).    Outpatient Encounter Medications as of 12/14/2017  Medication Sig  . aspirin EC 81 MG tablet Take 81 mg by mouth daily.  . Calcium Carb-Cholecalciferol (CALCIUM + D3 PO) Take 1 tablet by mouth daily.  Marland Kitchen escitalopram (LEXAPRO) 10 MG tablet Take 1 tablet (10 mg total) by mouth daily.  . furosemide (LASIX) 20 MG tablet Take 1 tablet (20 mg total) by mouth daily.  Marland Kitchen GLUCOSAMINE HCL PO Take 2 tablets by mouth daily.  . Multiple Vitamins-Minerals (CENTRUM SILVER PO) Take 1 tablet by mouth daily.  . Omega-3 Fatty Acids (FISH OIL PO)  Take 1 tablet by mouth daily.  . rosuvastatin (CRESTOR) 20 MG tablet TAKE 1 TABLET DAILY  . TURMERIC PO Take 1 tablet by mouth daily.   No facility-administered encounter medications on file as of 12/14/2017.     Activities of Daily Living In your present state of health, do you have any difficulty performing the following activities: 12/14/2017  Hearing? N  Vision? N  Difficulty concentrating or making decisions? N  Walking or climbing stairs? N  Dressing or bathing? N  Doing errands, shopping? N  Preparing Food and eating ? N  Using the Toilet? N  In the past six months, have you accidently leaked urine? N  Do you have problems with loss of bowel control? N  Managing your Medications? N  Managing your Finances? N  Housekeeping or managing your Housekeeping? N  Some recent data might be hidden    Patient Care Team: Joaquim Nam, MD as PCP - General (Family Medicine) Ernesto Rutherford, MD (Ophthalmology) McDiarmid, Leighton Roach, MD as Attending Physician (Family Medicine)    Assessment:   This is a routine wellness examination for [place].   Hearing Screening   125Hz  250Hz  500Hz  1000Hz  2000Hz  3000Hz  4000Hz  6000Hz  8000Hz   Right ear:   40 40 40  40    Left ear:   40 0 0  0    Vision Screening Comments: Vision exam in Jan 2019 with Dr. Dione Booze    Exercise Activities and Dietary recommendations Current Exercise Habits: Home exercise routine, Type of exercise: walking, Time (Minutes): 45, Frequency (Times/Week): 3, Weekly Exercise (Minutes/Week): 135, Intensity: Moderate, Exercise limited by: None identified  Goals    . Increase physical activity     Starting 12/14/2017, I will continue to walk at least 45 minutes 3 days per week.        Fall Risk Fall Risk  12/14/2017 12/08/2016  Falls in the past year? No No    Depression Screen PHQ 2/9 Scores 12/14/2017 12/08/2016  PHQ - 2 Score 1 1  PHQ- 9 Score 1 2     Cognitive Function MMSE - Mini Mental State Exam 12/14/2017  12/08/2016  Orientation to time 5 5  Orientation to Place 5 5  Registration 3 3  Attention/ Calculation 0 0  Recall 3 3  Language- name 2 objects 0 0  Language- repeat 1 1  Language- follow 3 step command 3 3  Language- read & follow direction 0 0  Write a sentence 0 0  Copy design 0 0  Total score 20 20  PLEASE NOTE: A Mini-Cog screen was completed. Maximum score is 20. A value of 0 denotes this part of Folstein MMSE was not completed or the patient failed this part of the Mini-Cog screening.   Mini-Cog Screening Orientation to Time - Max 5 pts Orientation to Place - Max 5 pts Registration - Max 3 pts Recall - Max 3 pts Language Repeat - Max 1 pts Language Follow 3 Step Command - Max 3 pts     Immunization History  Administered Date(s) Administered  . H1N1 02/21/2008  . Influenza Split 12/22/2010  . Influenza Whole 12/27/2007  . Influenza,inj,Quad PF,6+ Mos 11/24/2013, 11/27/2014, 12/03/2015, 12/08/2016, 12/14/2017  . Pneumococcal Conjugate-13 09/18/2008  . Pneumococcal Polysaccharide-23 10/03/2010  . Td 07/15/2003  . Tdap 11/24/2013  . Zoster 10/15/2011    Screening Tests Health Maintenance  Topic Date Due  . TETANUS/TDAP  11/25/2023  . COLONOSCOPY  01/17/2024  . INFLUENZA VACCINE  Completed  . DEXA SCAN  Completed  . PNA vac Low Risk Adult  Completed      Plan:     I have personally reviewed, addressed, and noted the following in the patient's chart:  A. Medical and social history B. Use of alcohol, tobacco or illicit drugs  C. Current medications and supplements D. Functional ability and status E.  Nutritional status F.  Physical activity G. Advance directives H. List of other physicians I.  Hospitalizations, surgeries, and ER visits in previous 12 months J.  Vitals K. Screenings to include hearing, vision, cognitive, depression L. Referrals and appointments - none  In addition, I have reviewed and discussed with patient certain preventive  protocols, quality metrics, and best practice recommendations. A written personalized care plan for preventive services as well as general preventive health recommendations were provided to patient.  See attached scanned questionnaire for additional information.   Signed,   Randa Evens, MHA, BS, LPN Health Coach

## 2017-12-14 NOTE — Progress Notes (Signed)
PCP notes:   Health maintenance:  Flu vaccine - administered  Abnormal screenings:   Hearing - failed  Hearing Screening   125Hz  250Hz  500Hz  1000Hz  2000Hz  3000Hz  4000Hz  6000Hz  8000Hz   Right ear:   40 40 40  40    Left ear:   40 0 0  0     Depression score: 1 Depression screen [facility] 2/9 12/14/2017 12/08/2016  Decreased Interest 0 0  Down, Depressed, Hopeless 1 1  PHQ - 2 Score 1 1  Altered sleeping 0 1  Tired, decreased energy 0 0  Change in appetite 0 0  Feeling bad or failure about yourself  0 0  Trouble concentrating 0 0  Moving slowly or fidgety/restless 0 0  Suicidal thoughts 0 0  PHQ-9 Score 1 2  Difficult doing work/chores Not difficult at all Not difficult at all    Patient concerns:   None  Nurse concerns:  None  Next PCP appt:   12/16/17 @ 1045  I reviewed health advisor's note, was available for consultation on the day of service listed in this note, and agree with documentation and plan. Crawford Givens, MD.

## 2017-12-16 ENCOUNTER — Ambulatory Visit (INDEPENDENT_AMBULATORY_CARE_PROVIDER_SITE_OTHER): Payer: Medicare Other | Admitting: Family Medicine

## 2017-12-16 ENCOUNTER — Encounter: Payer: Self-pay | Admitting: Family Medicine

## 2017-12-16 VITALS — BP 118/80 | HR 54 | Temp 98.5°F | Ht 60.0 in | Wt 141.8 lb

## 2017-12-16 DIAGNOSIS — R609 Edema, unspecified: Secondary | ICD-10-CM | POA: Diagnosis not present

## 2017-12-16 DIAGNOSIS — E785 Hyperlipidemia, unspecified: Secondary | ICD-10-CM | POA: Diagnosis not present

## 2017-12-16 DIAGNOSIS — F339 Major depressive disorder, recurrent, unspecified: Secondary | ICD-10-CM | POA: Diagnosis not present

## 2017-12-16 DIAGNOSIS — Z7189 Other specified counseling: Secondary | ICD-10-CM

## 2017-12-16 DIAGNOSIS — Z0000 Encounter for general adult medical examination without abnormal findings: Secondary | ICD-10-CM

## 2017-12-16 NOTE — Patient Instructions (Signed)
Thanks for getting a flu shot.  Keep working on walking.   Update me as needed.  Take care.  Glad to see you.  Your labs are fine.

## 2017-12-16 NOTE — Progress Notes (Signed)
Mood d/w pt.  She is still dealing with the loss of her son but is adjusting and "doing alright."  She'll update me as needed.  Still on lexapro, likely with some help from the medicine.   Taking lasix daily for edema.  No change in sx currently.  Edema controlled.  Stable rx.    Elevated Cholesterol: Using medications without problems:yes Muscle aches: no Diet compliance: encouraged.   Exercise: walking encouraged, d/w pt.  Labs d/w pt.    Mammogram due later this year.  d/w pt.   DXA wnl 2016.  Okay to defer as of 2019.   Colonoscopy 2015 Pap not due.   Diet and execrise d/w pt.  Encouraged both.   Advance directive d/w pt.  Husband designated if patient were incapacitated.   Flu shot 2019 Tdap 2015 zosta 2013 PNA up to date.   Hearing screening d/w pt.  Declined hearing aids.    She wanted to defer refills at this point.    PMH and SH reviewed  Meds, vitals, and allergies reviewed.   ROS: Per HPI unless specifically indicated in ROS section   GEN: nad, alert and oriented HEENT: mucous membranes moist NECK: supple w/o LA CV: rrr. PULM: ctab, no inc wob ABD: soft, +bs EXT: no edema SKIN: no acute rash

## 2017-12-17 NOTE — Assessment & Plan Note (Signed)
Advance directive- d/w pt. Husband designated if patient were incapacitated.  

## 2017-12-17 NOTE — Assessment & Plan Note (Signed)
Lipids discussed with patient.  Lipids improved on statin.  No adverse effect on medication.  Continue work on diet and exercise.  She agrees. >25 minutes spent in face to face time with patient, >50% spent in counselling or coordination of care.

## 2017-12-17 NOTE — Assessment & Plan Note (Signed)
Mammogram due later this year.  d/w pt.   DXA wnl 2016.  Okay to defer as of 2019.   Colonoscopy 2015 Pap not due.   Diet and execrise d/w pt.  Encouraged both.   Advance directive d/w pt.  Husband designated if patient were incapacitated.   Flu shot 2019 Tdap 2015 zosta 2013 PNA up to date.   Hearing screening d/w pt.  Declined hearing aids.

## 2017-12-17 NOTE — Assessment & Plan Note (Signed)
Discussed with patient.  Mood is reasonable given the death of her son last year.  She will update me as needed.  She likely had some benefit from the medication.  No reason to stop Lexapro at this point.  She agrees.  Okay for outpatient follow-up.

## 2017-12-17 NOTE — Assessment & Plan Note (Signed)
Has been on Lasix for years without adverse effect on medication.  Edema controlled with medication.  Labs discussed with patient.  Electrolytes okay.  Continue as is.

## 2017-12-27 ENCOUNTER — Other Ambulatory Visit: Payer: Self-pay | Admitting: Family Medicine

## 2018-01-14 ENCOUNTER — Other Ambulatory Visit: Payer: Self-pay | Admitting: Family Medicine

## 2018-01-14 DIAGNOSIS — Z1231 Encounter for screening mammogram for malignant neoplasm of breast: Secondary | ICD-10-CM

## 2018-02-25 ENCOUNTER — Ambulatory Visit
Admission: RE | Admit: 2018-02-25 | Discharge: 2018-02-25 | Disposition: A | Discharge status: Home / Self Care | Payer: Medicare Other | Source: Ambulatory Visit | Attending: Family Medicine | Admitting: Family Medicine

## 2018-02-25 DIAGNOSIS — Z1231 Encounter for screening mammogram for malignant neoplasm of breast: Secondary | ICD-10-CM

## 2018-03-06 ENCOUNTER — Other Ambulatory Visit: Payer: Self-pay | Admitting: Family Medicine

## 2018-10-16 HISTORY — PX: VITRECTOMY: SHX106

## 2018-11-10 ENCOUNTER — Ambulatory Visit (INDEPENDENT_AMBULATORY_CARE_PROVIDER_SITE_OTHER): Payer: Medicare Other

## 2018-11-10 DIAGNOSIS — Z23 Encounter for immunization: Secondary | ICD-10-CM

## 2018-11-18 ENCOUNTER — Other Ambulatory Visit: Payer: Self-pay | Admitting: Family Medicine

## 2018-11-18 DIAGNOSIS — Z1231 Encounter for screening mammogram for malignant neoplasm of breast: Secondary | ICD-10-CM

## 2018-12-02 ENCOUNTER — Other Ambulatory Visit: Payer: Self-pay | Admitting: Family Medicine

## 2018-12-20 ENCOUNTER — Ambulatory Visit: Payer: Medicare Other

## 2018-12-20 ENCOUNTER — Other Ambulatory Visit (INDEPENDENT_AMBULATORY_CARE_PROVIDER_SITE_OTHER): Payer: Medicare Other

## 2018-12-20 ENCOUNTER — Other Ambulatory Visit: Payer: Self-pay | Admitting: Family Medicine

## 2018-12-20 DIAGNOSIS — E785 Hyperlipidemia, unspecified: Secondary | ICD-10-CM

## 2018-12-20 LAB — COMPREHENSIVE METABOLIC PANEL
ALT: 18 U/L (ref 0–35)
AST: 23 U/L (ref 0–37)
Albumin: 4.4 g/dL (ref 3.5–5.2)
Alkaline Phosphatase: 45 U/L (ref 39–117)
BUN: 13 mg/dL (ref 6–23)
CO2: 26 mEq/L (ref 19–32)
Calcium: 9.8 mg/dL (ref 8.4–10.5)
Chloride: 107 mEq/L (ref 96–112)
Creatinine, Ser: 0.76 mg/dL (ref 0.40–1.20)
GFR: 89.48 mL/min (ref >60.00)
Glucose, Bld: 94 mg/dL (ref 70–99)
Potassium: 4 mEq/L (ref 3.5–5.1)
Sodium: 140 mEq/L (ref 135–145)
Total Bilirubin: 0.4 mg/dL (ref 0.2–1.2)
Total Protein: 7.1 g/dL (ref 6.0–8.3)

## 2018-12-20 LAB — LIPID PANEL
Cholesterol: 179 mg/dL (ref 0–200)
HDL: 70.6 mg/dL (ref >39.00)
LDL Cholesterol: 97 mg/dL (ref 0–99)
NonHDL: 108.66
Total CHOL/HDL Ratio: 3
Triglycerides: 60 mg/dL (ref 0.0–149.0)
VLDL: 12 mg/dL (ref 0.0–40.0)

## 2018-12-20 LAB — CBC WITH DIFFERENTIAL/PLATELET
Basophils Absolute: 0 10*3/uL (ref 0.0–0.1)
Basophils Relative: 0.6 % (ref 0.0–3.0)
Eosinophils Absolute: 0.1 10*3/uL (ref 0.0–0.7)
Eosinophils Relative: 1.9 % (ref 0.0–5.0)
HCT: 38.5 % (ref 36.0–46.0)
Hemoglobin: 12.9 g/dL (ref 12.0–15.0)
Lymphocytes Relative: 35.9 % (ref 12.0–46.0)
Lymphs Abs: 2.1 10*3/uL (ref 0.7–4.0)
MCHC: 33.5 g/dL (ref 30.0–36.0)
MCV: 88.1 fl (ref 78.0–100.0)
Monocytes Absolute: 0.4 10*3/uL (ref 0.1–1.0)
Monocytes Relative: 7.2 % (ref 3.0–12.0)
Neutro Abs: 3.2 10*3/uL (ref 1.4–7.7)
Neutrophils Relative %: 54.4 % (ref 43.0–77.0)
Platelets: 192 10*3/uL (ref 150.0–400.0)
RBC: 4.37 Mil/uL (ref 3.87–5.11)
RDW: 14.5 % (ref 11.5–15.5)
WBC: 5.8 10*3/uL (ref 4.0–10.5)

## 2018-12-20 LAB — TSH: TSH: 0.65 u[IU]/mL (ref 0.35–4.50)

## 2018-12-22 ENCOUNTER — Other Ambulatory Visit: Payer: Self-pay | Admitting: Family Medicine

## 2018-12-23 NOTE — Telephone Encounter (Signed)
Patient has an appointment on 12/27/2018. Last filled on 12/27/2017 for 1 year worth.

## 2018-12-24 NOTE — Telephone Encounter (Signed)
Sent. Thanks.   

## 2018-12-27 ENCOUNTER — Encounter: Payer: Self-pay | Admitting: Family Medicine

## 2018-12-27 ENCOUNTER — Other Ambulatory Visit: Payer: Self-pay

## 2018-12-27 ENCOUNTER — Ambulatory Visit (INDEPENDENT_AMBULATORY_CARE_PROVIDER_SITE_OTHER): Payer: Medicare Other | Admitting: Family Medicine

## 2018-12-27 VITALS — BP 120/78 | HR 65 | Temp 98.1°F | Ht 59.75 in | Wt 142.0 lb

## 2018-12-27 DIAGNOSIS — E785 Hyperlipidemia, unspecified: Secondary | ICD-10-CM | POA: Diagnosis not present

## 2018-12-27 DIAGNOSIS — Z0000 Encounter for general adult medical examination without abnormal findings: Secondary | ICD-10-CM

## 2018-12-27 DIAGNOSIS — R609 Edema, unspecified: Secondary | ICD-10-CM | POA: Diagnosis not present

## 2018-12-27 DIAGNOSIS — L989 Disorder of the skin and subcutaneous tissue, unspecified: Secondary | ICD-10-CM

## 2018-12-27 DIAGNOSIS — F339 Major depressive disorder, recurrent, unspecified: Secondary | ICD-10-CM

## 2018-12-27 DIAGNOSIS — Z7189 Other specified counseling: Secondary | ICD-10-CM

## 2018-12-27 MED ORDER — ROSUVASTATIN CALCIUM 20 MG PO TABS: 20.0000 mg | ORAL_TABLET | Freq: Every day | ORAL | 3 refills | Status: DC

## 2018-12-27 NOTE — Progress Notes (Signed)
I have personally reviewed the Medicare Annual Wellness questionnaire and have noted 1. The patient's medical and social history 2. Their use of alcohol, tobacco or illicit drugs 3. Their current medications and supplements 4. The patient's functional ability including ADL's, fall risks, home safety risks and hearing or visual             impairment. 5. Diet and physical activities 6. Evidence for depression or mood disorders  The patients weight, height, BMI have been recorded in the chart and visual acuity is per eye clinic.  I have made referrals, counseling and provided education to the patient based review of the above and I have provided the pt with a written personalized care plan for preventive services.  Provider list updated- see scanned forms.  Routine anticipatory guidance given to patient.  See health maintenance. The possibility exists that previously documented standard health maintenance information may have been brought forward from a previous encounter into this note.  If needed, that same information has been updated to reflect the current situation based on today's encounter.    Flu 2020 Shingles discussed with PNA up-to-date Tetanus 2015 Colon cancer screening 2015 Breast cancer screening 2019 dxa 2016, consider in 2021 based on discussion with patient Advance directive-husband designated patient were incapacitated Cognitive function addressed- see scanned forms- and if abnormal then additional documentation follows.   Elevated Cholesterol: Using medications without problems:yes Muscle aches: no Diet compliance:yes Exercise:yes Labs d/w pt.  She had been on crestor and RYR.   Taking lasix QD. Edema controlled. Labs d/w pt. no adverse effect on medication.  She has some occ transient skin lesions that resolve occ.  Not bruised.  Not itchy.  She'll have them for a week or so, but they have been going on for years.  Not a typical psoriatic plaque.    Mood d/w pt.   lexapro 10mg  a day.  Has been on med for years.  D/w pt about options.  Her son died ~2 years ago.  She wanted to continue med. Mood is still good.    PMH and SH reviewed  Meds, vitals, and allergies reviewed.   ROS: Per HPI.  Unless specifically indicated otherwise in HPI, the patient denies:  General: fever. Eyes: acute vision changes ENT: sore throat Cardiovascular: chest pain Respiratory: SOB GI: vomiting GU: dysuria Musculoskeletal: acute back pain Derm: acute rash Neuro: acute motor dysfunction Psych: worsening mood Endocrine: polydipsia Heme: bleeding Allergy: hayfever  GEN: nad, alert and oriented HEENT: ncat NECK: supple w/o LA CV: rrr. PULM: ctab, no inc wob ABD: soft, +bs EXT: no edema SKIN: no acute rash other than a small slightly hyperpigmented macule on the right shin.  Noted at time of exam, this is typical for the type of lesion that she will have erupte and then spontaneously resolve within approximately 1 week.  This is been going on for years at baseline.  No new changes.  The lesion is not ulcerated and does not look typical for psoriatic plaque.  Health Maintenance  Topic Date Due  . TETANUS/TDAP  11/25/2023  . INFLUENZA VACCINE  Completed  . DEXA SCAN  Completed  . PNA vac Low Risk Adult  Completed

## 2018-12-27 NOTE — Patient Instructions (Addendum)
Check with your insurance to see if they will cover the shingrix shot. You can stop the red yeast rice if you want to.  Take care.  Glad to see you.  Update me as needed.

## 2018-12-28 NOTE — Assessment & Plan Note (Signed)
This does not look typical for psoriatic plaque.  Not ulcerated and not typical for a skin cancer.  As long as these resolve I do not think she has to do anything else.  It could be a type of localized eczema variant but it would not need intervention given that they have always self resolved in the past.  She will update me as needed.  She agrees.

## 2018-12-28 NOTE — Assessment & Plan Note (Signed)
Mood d/w pt.  lexapro 10mg  a day.  Has been on med for years.  D/w pt about options.  Her son died ~2 years ago.  She wanted to continue med. Mood is still good.   Would continue as is.  She agrees.

## 2018-12-28 NOTE — Assessment & Plan Note (Signed)
Controlled with Lasix.  Labs discussed with patient.  Continue as is.

## 2018-12-28 NOTE — Assessment & Plan Note (Signed)
Labs discussed with patient.  Continue work on diet and exercise.  She can discontinue red yeast rice if desired.

## 2018-12-28 NOTE — Assessment & Plan Note (Signed)
  Flu 2020 Shingles discussed with PNA up-to-date Tetanus 2015 Colon cancer screening 2015 Breast cancer screening 2019 dxa 2016, consider in 2021 based on discussion with patient Advance directive-husband designated patient were incapacitated Cognitive function addressed- see scanned forms- and if abnormal then additional documentation follows.

## 2018-12-28 NOTE — Assessment & Plan Note (Signed)
Advance directive-husband designated patient were incapacitated 

## 2019-03-06 ENCOUNTER — Other Ambulatory Visit: Payer: Self-pay

## 2019-03-06 ENCOUNTER — Ambulatory Visit
Admission: RE | Admit: 2019-03-06 | Discharge: 2019-03-06 | Disposition: A | Discharge status: Home / Self Care | Payer: Medicare Other | Source: Ambulatory Visit | Attending: Family Medicine | Admitting: Family Medicine

## 2019-03-06 DIAGNOSIS — Z1231 Encounter for screening mammogram for malignant neoplasm of breast: Secondary | ICD-10-CM

## 2019-07-11 ENCOUNTER — Other Ambulatory Visit: Payer: Self-pay | Admitting: *Deleted

## 2019-07-11 MED ORDER — ROSUVASTATIN CALCIUM 20 MG PO TABS: 20.0000 mg | ORAL_TABLET | Freq: Every day | ORAL | 3 refills | Status: DC

## 2019-07-11 MED ORDER — FUROSEMIDE 20 MG PO TABS: 20.0000 mg | ORAL_TABLET | Freq: Every day | ORAL | 3 refills | Status: DC

## 2019-10-20 ENCOUNTER — Other Ambulatory Visit: Payer: Self-pay | Admitting: *Deleted

## 2019-10-20 MED ORDER — ESCITALOPRAM OXALATE 10 MG PO TABS: 10.0000 mg | ORAL_TABLET | Freq: Every day | ORAL | 0 refills | Status: DC

## 2019-12-07 ENCOUNTER — Other Ambulatory Visit: Payer: Self-pay

## 2019-12-07 ENCOUNTER — Ambulatory Visit (INDEPENDENT_AMBULATORY_CARE_PROVIDER_SITE_OTHER): Payer: Medicare Other

## 2019-12-07 DIAGNOSIS — Z23 Encounter for immunization: Secondary | ICD-10-CM | POA: Diagnosis not present

## 2019-12-08 ENCOUNTER — Telehealth: Payer: Self-pay | Admitting: Family Medicine

## 2019-12-08 NOTE — Telephone Encounter (Signed)
For reference- CDC recommendation:  People 65 years and older should receive a booster shot of Pfizer-BioNTech's COVID-19 vaccine at least 6 months after their Pfizer-BioNTech primary series.   If her initial series was from [employer], then she should be able to get booster at any point.  Please verify her initial vaccine data and enter in EMR under vaccine hx.    Website pasted below has information to schedule a vaccination appointment.  I expect [facility] to rollout additional scheduling appointments in the near future.  Thanks.  [email or web address]

## 2019-12-08 NOTE — Telephone Encounter (Signed)
Pt called she wanted know when she can get her covid booster vaccine  1st  04/08/19 2nd 04/29/19 Flu shot 12/07/19 Please advise

## 2019-12-11 NOTE — Telephone Encounter (Signed)
Patient advised.

## 2019-12-15 ENCOUNTER — Other Ambulatory Visit: Payer: Self-pay | Admitting: Family Medicine

## 2019-12-15 DIAGNOSIS — E785 Hyperlipidemia, unspecified: Secondary | ICD-10-CM

## 2019-12-15 DIAGNOSIS — R7989 Other specified abnormal findings of blood chemistry: Secondary | ICD-10-CM

## 2019-12-22 ENCOUNTER — Other Ambulatory Visit: Payer: Self-pay

## 2019-12-22 ENCOUNTER — Ambulatory Visit (INDEPENDENT_AMBULATORY_CARE_PROVIDER_SITE_OTHER): Payer: Medicare Other

## 2019-12-22 DIAGNOSIS — Z0000 Encounter for general adult medical examination without abnormal findings: Secondary | ICD-10-CM

## 2019-12-22 NOTE — Patient Instructions (Signed)
Tanya Espinoza , Thank you for taking time to come for your Medicare Wellness Visit. I appreciate your ongoing commitment to your health goals. Please review the following plan we discussed and let me know if I can assist you in the future.   Screening recommendations/referrals: Colonoscopy: Up to date, completed 01/16/2014, due 01/2024 Mammogram: Up to date, completed 03/06/2019, due 02/2020 Bone Density: due, will discuss with provider at physical Recommended yearly ophthalmology/optometry visit for glaucoma screening and checkup Recommended yearly dental visit for hygiene and checkup  Vaccinations: Influenza vaccine: Up to date, completed 12/07/2019, due 10/2020 Pneumococcal vaccine: Completed series Tdap vaccine: Up to date, completed 11/24/2013, due 11/2023 Shingles vaccine: due, check with your insurance regarding coverage if interested   Covid-19:Completed series  Advanced directives: Advance directive discussed with you today. Even though you declined this today please call our office should you change your mind and we can give you the proper paperwork for you to fill out.  Conditions/risks identified: dyslipidemia  Next appointment: Follow up in one year for your annual wellness visit    Preventive Care 65 Years and Older, Female Preventive care refers to lifestyle choices and visits with your health care provider that can promote health and wellness. What does preventive care include?  A yearly physical exam. This is also called an annual well check.  Dental exams once or twice a year.  Routine eye exams. Ask your health care provider how often you should have your eyes checked.  Personal lifestyle choices, including:  Daily care of your teeth and gums.  Regular physical activity.  Eating a healthy diet.  Avoiding tobacco and drug use.  Limiting alcohol use.  Practicing safe sex.  Taking low-dose aspirin every day.  Taking vitamin and mineral supplements as  recommended by your health care provider. What happens during an annual well check? The services and screenings done by your health care provider during your annual well check will depend on your age, overall health, lifestyle risk factors, and family history of disease. Counseling  Your health care provider may ask you questions about your:  Alcohol use.  Tobacco use.  Drug use.  Emotional well-being.  Home and relationship well-being.  Sexual activity.  Eating habits.  History of falls.  Memory and ability to understand (cognition).  Work and work Astronomer.  Reproductive health. Screening  You may have the following tests or measurements:  Height, weight, and BMI.  Blood pressure.  Lipid and cholesterol levels. These may be checked every 5 years, or more frequently if you are over 37 years old.  Skin check.  Lung cancer screening. You may have this screening every year starting at age 63 if you have a 30-pack-year history of smoking and currently smoke or have quit within the past 15 years.  Fecal occult blood test (FOBT) of the stool. You may have this test every year starting at age 43.  Flexible sigmoidoscopy or colonoscopy. You may have a sigmoidoscopy every 5 years or a colonoscopy every 10 years starting at age 69.  Hepatitis C blood test.  Hepatitis B blood test.  Sexually transmitted disease (STD) testing.  Diabetes screening. This is done by checking your blood sugar (glucose) after you have not eaten for a while (fasting). You may have this done every 1-3 years.  Bone density scan. This is done to screen for osteoporosis. You may have this done starting at age 57.  Mammogram. This may be done every 1-2 years. Talk to your health care  provider about how often you should have regular mammograms. Talk with your health care provider about your test results, treatment options, and if necessary, the need for more tests. Vaccines  Your health care  provider may recommend certain vaccines, such as:  Influenza vaccine. This is recommended every year.  Tetanus, diphtheria, and acellular pertussis (Tdap, Td) vaccine. You may need a Td booster every 10 years.  Zoster vaccine. You may need this after age 48.  Pneumococcal 13-valent conjugate (PCV13) vaccine. One dose is recommended after age 36.  Pneumococcal polysaccharide (PPSV23) vaccine. One dose is recommended after age 52. Talk to your health care provider about which screenings and vaccines you need and how often you need them. This information is not intended to replace advice given to you by your health care provider. Make sure you discuss any questions you have with your health care provider. Document Released: 03/29/2015 Document Revised: 11/20/2015 Document Reviewed: 01/01/2015 Elsevier Interactive Patient Education  2017 [employer].  Fall Prevention in the Home Falls can cause injuries. They can happen to people of all ages. There are many things you can do to make your home safe and to help prevent falls. What can I do on the outside of my home?  Regularly fix the edges of walkways and driveways and fix any cracks.  Remove anything that might make you trip as you walk through a door, such as a raised step or threshold.  Trim any bushes or trees on the path to your home.  Use bright outdoor lighting.  Clear any walking paths of anything that might make someone trip, such as rocks or tools.  Regularly check to see if handrails are loose or broken. Make sure that both sides of any steps have handrails.  Any raised decks and porches should have guardrails on the edges.  Have any leaves, snow, or ice cleared regularly.  Use sand or salt on walking paths during winter.  Clean up any spills in your garage right away. This includes oil or grease spills. What can I do in the bathroom?  Use night lights.  Install grab bars by the toilet and in the tub and shower. Do  not use towel bars as grab bars.  Use non-skid mats or decals in the tub or shower.  If you need to sit down in the shower, use a plastic, non-slip stool.  Keep the floor dry. Clean up any water that spills on the floor as soon as it happens.  Remove soap buildup in the tub or shower regularly.  Attach bath mats securely with double-sided non-slip rug tape.  Do not have throw rugs and other things on the floor that can make you trip. What can I do in the bedroom?  Use night lights.  Make sure that you have a light by your bed that is easy to reach.  Do not use any sheets or blankets that are too big for your bed. They should not hang down onto the floor.  Have a firm chair that has side arms. You can use this for support while you get dressed.  Do not have throw rugs and other things on the floor that can make you trip. What can I do in the kitchen?  Clean up any spills right away.  Avoid walking on wet floors.  Keep items that you use a lot in easy-to-reach places.  If you need to reach something above you, use a strong step stool that has a grab bar.  Keep electrical cords out of the way.  Do not use floor polish or wax that makes floors slippery. If you must use wax, use non-skid floor wax.  Do not have throw rugs and other things on the floor that can make you trip. What can I do with my stairs?  Do not leave any items on the stairs.  Make sure that there are handrails on both sides of the stairs and use them. Fix handrails that are broken or loose. Make sure that handrails are as long as the stairways.  Check any carpeting to make sure that it is firmly attached to the stairs. Fix any carpet that is loose or worn.  Avoid having throw rugs at the top or bottom of the stairs. If you do have throw rugs, attach them to the floor with carpet tape.  Make sure that you have a light switch at the top of the stairs and the bottom of the stairs. If you do not have them,  ask someone to add them for you. What else can I do to help prevent falls?  Wear shoes that:  Do not have high heels.  Have rubber bottoms.  Are comfortable and fit you well.  Are closed at the toe. Do not wear sandals.  If you use a stepladder:  Make sure that it is fully opened. Do not climb a closed stepladder.  Make sure that both sides of the stepladder are locked into place.  Ask someone to hold it for you, if possible.  Clearly mark and make sure that you can see:  Any grab bars or handrails.  First and last steps.  Where the edge of each step is.  Use tools that help you move around (mobility aids) if they are needed. These include:  Canes.  Walkers.  Scooters.  Crutches.  Turn on the lights when you go into a dark area. Replace any light bulbs as soon as they burn out.  Set up your furniture so you have a clear path. Avoid moving your furniture around.  If any of your floors are uneven, fix them.  If there are any pets around you, be aware of where they are.  Review your medicines with your doctor. Some medicines can make you feel dizzy. This can increase your chance of falling. Ask your doctor what other things that you can do to help prevent falls. This information is not intended to replace advice given to you by your health care provider. Make sure you discuss any questions you have with your health care provider. Document Released: 12/27/2008 Document Revised: 08/08/2015 Document Reviewed: 04/06/2014 Elsevier Interactive Patient Education  2017 [employer].

## 2019-12-22 NOTE — Progress Notes (Signed)
Subjective:   Tanya Espinoza is a 79 y.o. female who presents for Medicare Annual (Subsequent) preventive examination.  Review of Systems: N/A      I connected with the patient today by telephone and verified that I am speaking with the correct person using two identifiers. Location patient: home Location nurse: work Persons participating in the telephone visit: patient, nurse.   I discussed the limitations, risks, security and privacy concerns of performing an evaluation and management service by telephone and the availability of in person appointments. I also discussed with the patient that there may be a patient responsible charge related to this service. The patient expressed understanding and verbally consented to this telephonic visit.        Cardiac Risk Factors include: advanced age (>25men, >40 women);Other (see comment), Risk factor comments: dyslipidemia     Objective:    Today's Vitals   There is no height or weight on file to calculate BMI.  Advanced Directives 12/22/2019 12/14/2017 12/08/2016 12/03/2015 07/26/2015 11/27/2014 01/01/2014  Does Patient Have a Medical Advance Directive? No No Yes No No No No  Type of Advance Directive - - Living will - - - -  Would patient like information on creating a medical advance directive? No - Patient declined No - Patient declined - - - - -    Current Medications (verified) Outpatient Encounter Medications as of 12/22/2019  Medication Sig  . aspirin EC 81 MG tablet Take 81 mg by mouth daily.  . Calcium Carb-Cholecalciferol (CALCIUM + D3 PO) Take 1 tablet by mouth daily.  Marland Kitchen escitalopram (LEXAPRO) 10 MG tablet Take 1 tablet (10 mg total) by mouth daily. Please make an appointment for physical exam prior to further refills.  . furosemide (LASIX) 20 MG tablet Take 1 tablet (20 mg total) by mouth daily.  Marland Kitchen GLUCOSAMINE HCL PO Take 2 tablets by mouth daily.  . Magnesium 400 MG TABS Take by mouth.  . Multiple Vitamins-Minerals  (CENTRUM SILVER PO) Take 1 tablet by mouth daily.  . Omega-3 Fatty Acids (FISH OIL PO) Take 1 tablet by mouth daily.  . Red Yeast Rice 600 MG CAPS Take by mouth.  . rosuvastatin (CRESTOR) 20 MG tablet Take 1 tablet (20 mg total) by mouth daily.  . TURMERIC PO Take 1 tablet by mouth daily.   No facility-administered encounter medications on file as of 12/22/2019.    Allergies (verified) Patient has no known allergies.   History: Past Medical History:  Diagnosis Date  . ASYMPTOMATIC POSTMENOPAUSAL STATUS 08/23/2007  . BRADYCARDIA 08/23/2007  . DEPRESSION 11/30/2006  . HYPERLIPIDEMIA 11/30/2006  . HYPERTENSION 11/30/2006  . PSORIASIS 11/30/2006  . UTI'S, HX OF 11/30/2006   Past Surgical History:  Procedure Laterality Date  . EYE SURGERY     cataracts bil.  Marland Kitchen VITRECTOMY Left 10/16/2018   25 Gauge membrane peel   Family History  Problem Relation Age of Onset  . Cancer Sister        Breast Cancer  . Stroke Mother   . Breast cancer Sister   . Colon cancer Neg Hx    Social History   Socioeconomic History  . Marital status: Married    Spouse name: Not on file  . Number of children: Not on file  . Years of education: Not on file  . Highest education level: Not on file  Occupational History  . Occupation: Community education officer  Tobacco Use  . Smoking status: Never Smoker  . Smokeless tobacco: Never Used  Vaping  Use  . Vaping Use: Never used  Substance and Sexual Activity  . Alcohol use: No  . Drug use: No  . Sexual activity: Yes    Birth control/protection: None  Other Topics Concern  . Not on file  Social History Narrative   From [place]   Retired from [employer]   Married 1969/07/12   1 son alive as of 12-Jul-2017- he lives in [place] (her other son died in Jul 12, 2016).    Social Determinants of Health   Financial Resource Strain: Low Risk   . Difficulty of Paying Living Expenses: Not hard at all  Food Insecurity: No Food Insecurity  . Worried About Programme researcher, broadcasting/film/video in  the Last Year: Never true  . Ran Out of Food in the Last Year: Never true  Transportation Needs: No Transportation Needs  . Lack of Transportation (Medical): No  . Lack of Transportation (Non-Medical): No  Physical Activity: Sufficiently Active  . Days of Exercise per Week: 5 days  . Minutes of Exercise per Session: 30 min  Stress: No Stress Concern Present  . Feeling of Stress : Not at all  Social Connections:   . Frequency of Communication with Friends and Family: Not on file  . Frequency of Social Gatherings with Friends and Family: Not on file  . Attends Religious Services: Not on file  . Active Member of Clubs or Organizations: Not on file  . Attends Banker Meetings: Not on file  . Marital Status: Not on file    Tobacco Counseling Counseling given: Not Answered   Clinical Intake:  Pre-visit preparation completed: Yes  Pain : No/denies pain     Nutritional Risks: None Diabetes: No  How often do you need to have someone help you when you read instructions, pamphlets, or other written materials from your doctor or pharmacy?: 1 - Never What is the last grade level you completed in school?: some college  Diabetic: No Nutrition Risk Assessment:  Has the patient had any N/V/D within the last 2 months?  No  Does the patient have any non-healing wounds?  No  Has the patient had any unintentional weight loss or weight gain?  No   Diabetes:  Is the patient diabetic?  No  If diabetic, was a CBG obtained today?  N/A Did the patient bring in their glucometer from home?  N/A How often do you monitor your CBG's? N/A.   Financial Strains and Diabetes Management:  Are you having any financial strains with the device, your supplies or your medication? N/A.  Does the patient want to be seen by Chronic Care Management for management of their diabetes?  N/A Would the patient like to be referred to a Nutritionist or for Diabetic Management?  N/A   Interpreter  Needed?: No  Information entered by :: CJohnson, LPN   Activities of Daily Living In your present state of health, do you have any difficulty performing the following activities: 12/22/2019  Hearing? N  Vision? N  Difficulty concentrating or making decisions? N  Walking or climbing stairs? N  Dressing or bathing? N  Doing errands, shopping? N  Preparing Food and eating ? N  Using the Toilet? N  In the past six months, have you accidently leaked urine? N  Do you have problems with loss of bowel control? N  Managing your Medications? N  Managing your Finances? N  Housekeeping or managing your Housekeeping? N  Some recent data might be hidden  Patient Care Team: Joaquim Namuncan, Graham S, MD as PCP - General (Family Medicine) Ernesto RutherfordGroat, Robert, MD (Ophthalmology) McDiarmid, Leighton Roachodd D, MD as Attending Physician (Family Medicine)  Indicate any recent Medical Services you may have received from other than [facility] providers in the past year (date may be approximate).     Assessment:   This is a routine wellness examination for Tanya Espinoza.  Hearing/Vision screen  Hearing Screening   125Hz  250Hz  500Hz  1000Hz  2000Hz  3000Hz  4000Hz  6000Hz  8000Hz   Right ear:           Left ear:           Vision Screening Comments: Patient gets annual eye exams   Dietary issues and exercise activities discussed: Current Exercise Habits: Home exercise routine, Type of exercise: Other - see comments (stationary bike), Time (Minutes): 30, Frequency (Times/Week): 5, Weekly Exercise (Minutes/Week): 150, Intensity: Moderate, Exercise limited by: None identified  Goals    . Increase physical activity     Starting 12/14/2017, I will continue to walk at least 45 minutes 3 days per week.     . Patient Stated     12/22/2019, I will continue to ride my stationary bike 5 days a week for 30 minutes.       Depression Screen PHQ 2/9 Scores 12/22/2019 12/14/2017 12/08/2016  PHQ - 2 Score 0 1 1  PHQ- 9 Score 0 1 2    Fall  Risk Fall Risk  12/22/2019 12/27/2018 12/14/2017 12/08/2016  Falls in the past year? 0 0 No No  Number falls in past yr: 0 0 - -  Injury with Fall? 0 - - -  Risk for fall due to : No Fall Risks - - -  Follow up Falls evaluation completed;Falls prevention discussed - - -    Any stairs in or around the home? Yes  If so, are there any without handrails? No  Home free of loose throw rugs in walkways, pet beds, electrical cords, etc? Yes  Adequate lighting in your home to reduce risk of falls? Yes   ASSISTIVE DEVICES UTILIZED TO PREVENT FALLS:  Life alert? No  Use of a cane, walker or w/c? No  Grab bars in the bathroom? No  Shower chair or bench in shower? No  Elevated toilet seat or a handicapped toilet? No   TIMED UP AND GO:  Was the test performed? N/A, telephonic visit.    Cognitive Function: MMSE - Mini Mental State Exam 12/22/2019 12/14/2017 12/08/2016  Orientation to time 5 5 5   Orientation to Place 5 5 5   Registration 3 3 3   Attention/ Calculation 5 0 0  Recall 3 3 3   Language- name 2 objects - 0 0  Language- repeat 1 1 1   Language- follow 3 step command - 3 3  Language- read & follow direction - 0 0  Write a sentence - 0 0  Copy design - 0 0  Total score - 20 20  Mini Cog  Mini-Cog screen was completed. Maximum score is 22. A value of 0 denotes this part of the MMSE was not completed or the patient failed this part of the Mini-Cog screening.       Immunizations Immunization History  Administered Date(s) Administered  . Fluad Quad(high Dose 65+) 11/10/2018, 12/07/2019  . H1N1 02/21/2008  . Influenza Split 12/22/2010  . Influenza Whole 12/27/2007  . Influenza,inj,Quad PF,6+ Mos 11/24/2013, 11/27/2014, 12/03/2015, 12/08/2016, 12/14/2017  . PFIZER SARS-COV-2 Vaccination 04/08/2019, 04/29/2019  . Pneumococcal Conjugate-13 09/18/2008  . Pneumococcal Polysaccharide-23  10/03/2010  . Td 07/15/2003  . Tdap 11/24/2013  . Zoster 10/15/2011    TDAP status: Up to  date Flu Vaccine status: Up to date Pneumococcal vaccine status: Up to date Covid-19 vaccine status: Completed vaccines  Qualifies for Shingles Vaccine? Yes   Zostavax completed Yes   Shingrix Completed?: No.    Education has been provided regarding the importance of this vaccine. Patient has been advised to call insurance company to determine out of pocket expense if they have not yet received this vaccine. Advised may also receive vaccine at local pharmacy or Health Dept. Verbalized acceptance and understanding.  Screening Tests Health Maintenance  Topic Date Due  . Hepatitis C Screening  Never done  . TETANUS/TDAP  11/25/2023  . INFLUENZA VACCINE  Completed  . DEXA SCAN  Completed  . COVID-19 Vaccine  Completed  . PNA vac Low Risk Adult  Completed    Health Maintenance  Health Maintenance Due  Topic Date Due  . Hepatitis C Screening  Never done    Colorectal cancer screening: Completed 01/16/2014. Repeat every 10 years Mammogram status: Completed 03/06/2019. Repeat every year Bone Density status: due, will discuss with provider  Lung Cancer Screening: (Low Dose CT Chest recommended if Age 19-80 years, 30 pack-year currently smoking OR have quit w/in 15years.) does not qualify.    Additional Screening:  Hepatitis C Screening: does qualify; Completed due  Vision Screening: Recommended annual ophthalmology exams for early detection of glaucoma and other disorders of the eye. Is the patient up to date with their annual eye exam?  Yes  Who is the provider or what is the name of the office in which the patient attends annual eye exams? Dr. Ernesto Rutherford If pt is not established with a provider, would they like to be referred to a provider to establish care? No .   Dental Screening: Recommended annual dental exams for proper oral hygiene  Community Resource Referral / Chronic Care Management: CRR required this visit?  No   CCM required this visit?  No      Plan:     I  have personally reviewed and noted the following in the patient's chart:   . Medical and social history . Use of alcohol, tobacco or illicit drugs  . Current medications and supplements . Functional ability and status . Nutritional status . Physical activity . Advanced directives . List of other physicians . Hospitalizations, surgeries, and ER visits in previous 12 months . Vitals . Screenings to include cognitive, depression, and falls . Referrals and appointments  In addition, I have reviewed and discussed with patient certain preventive protocols, quality metrics, and best practice recommendations. A written personalized care plan for preventive services as well as general preventive health recommendations were provided to patient.   Due to this being a telephonic visit, the after visit summary with patients personalized plan was offered to patient via office or my-chart. Patient preferred to pick up at office at next visit or via mychart.   Janalyn Shy, LPN   [id number]

## 2019-12-22 NOTE — Progress Notes (Signed)
PCP notes:  Health Maintenance: Dexa- due   Abnormal Screenings: none   Patient concerns: none   Nurse concerns: none   Next PCP appt.: 12/29/2019 @ 2:30 pm

## 2019-12-25 ENCOUNTER — Other Ambulatory Visit: Payer: Self-pay

## 2019-12-25 ENCOUNTER — Other Ambulatory Visit (INDEPENDENT_AMBULATORY_CARE_PROVIDER_SITE_OTHER): Payer: Medicare Other

## 2019-12-25 DIAGNOSIS — E785 Hyperlipidemia, unspecified: Secondary | ICD-10-CM

## 2019-12-25 DIAGNOSIS — R7989 Other specified abnormal findings of blood chemistry: Secondary | ICD-10-CM | POA: Diagnosis not present

## 2019-12-25 LAB — COMPREHENSIVE METABOLIC PANEL
ALT: 16 U/L (ref 0–35)
AST: 22 U/L (ref 0–37)
Albumin: 4.1 g/dL (ref 3.5–5.2)
Alkaline Phosphatase: 47 U/L (ref 39–117)
BUN: 15 mg/dL (ref 6–23)
CO2: 27 mEq/L (ref 19–32)
Calcium: 9.2 mg/dL (ref 8.4–10.5)
Chloride: 109 mEq/L (ref 96–112)
Creatinine, Ser: 0.74 mg/dL (ref 0.40–1.20)
GFR: 78.01 mL/min (ref >60.00)
Glucose, Bld: 88 mg/dL (ref 70–99)
Potassium: 4.2 mEq/L (ref 3.5–5.1)
Sodium: 142 mEq/L (ref 135–145)
Total Bilirubin: 0.5 mg/dL (ref 0.2–1.2)
Total Protein: 6.4 g/dL (ref 6.0–8.3)

## 2019-12-25 LAB — CBC WITH DIFFERENTIAL/PLATELET
Basophils Absolute: 0 10*3/uL (ref 0.0–0.1)
Basophils Relative: 0.6 % (ref 0.0–3.0)
Eosinophils Absolute: 0.1 10*3/uL (ref 0.0–0.7)
Eosinophils Relative: 1.9 % (ref 0.0–5.0)
HCT: 36.4 % (ref 36.0–46.0)
Hemoglobin: 12.2 g/dL (ref 12.0–15.0)
Lymphocytes Relative: 29.2 % (ref 12.0–46.0)
Lymphs Abs: 1.7 10*3/uL (ref 0.7–4.0)
MCHC: 33.4 g/dL (ref 30.0–36.0)
MCV: 89.2 fl (ref 78.0–100.0)
Monocytes Absolute: 0.4 10*3/uL (ref 0.1–1.0)
Monocytes Relative: 7.1 % (ref 3.0–12.0)
Neutro Abs: 3.6 10*3/uL (ref 1.4–7.7)
Neutrophils Relative %: 61.2 % (ref 43.0–77.0)
Platelets: 168 10*3/uL (ref 150.0–400.0)
RBC: 4.07 Mil/uL (ref 3.87–5.11)
RDW: 14.8 % (ref 11.5–15.5)
WBC: 5.9 10*3/uL (ref 4.0–10.5)

## 2019-12-25 LAB — LIPID PANEL
Cholesterol: 160 mg/dL (ref 0–200)
HDL: 73.5 mg/dL (ref >39.00)
LDL Cholesterol: 77 mg/dL (ref 0–99)
NonHDL: 86.33
Total CHOL/HDL Ratio: 2
Triglycerides: 47 mg/dL (ref 0.0–149.0)
VLDL: 9.4 mg/dL (ref 0.0–40.0)

## 2019-12-25 LAB — TSH: TSH: 1.12 u[IU]/mL (ref 0.35–4.50)

## 2019-12-29 ENCOUNTER — Encounter: Payer: Medicare Other | Admitting: Family Medicine

## 2020-01-09 ENCOUNTER — Ambulatory Visit (INDEPENDENT_AMBULATORY_CARE_PROVIDER_SITE_OTHER): Payer: Medicare Other | Admitting: Family Medicine

## 2020-01-09 ENCOUNTER — Other Ambulatory Visit: Payer: Self-pay

## 2020-01-09 ENCOUNTER — Ambulatory Visit: Payer: Medicare Other

## 2020-01-09 ENCOUNTER — Encounter: Payer: Self-pay | Admitting: Family Medicine

## 2020-01-09 VITALS — BP 122/72 | HR 66 | Temp 97.2°F | Ht 59.5 in | Wt 136.4 lb

## 2020-01-09 DIAGNOSIS — Z0000 Encounter for general adult medical examination without abnormal findings: Secondary | ICD-10-CM

## 2020-01-09 DIAGNOSIS — R609 Edema, unspecified: Secondary | ICD-10-CM | POA: Diagnosis not present

## 2020-01-09 DIAGNOSIS — Z7189 Other specified counseling: Secondary | ICD-10-CM

## 2020-01-09 DIAGNOSIS — F339 Major depressive disorder, recurrent, unspecified: Secondary | ICD-10-CM

## 2020-01-09 DIAGNOSIS — E785 Hyperlipidemia, unspecified: Secondary | ICD-10-CM

## 2020-01-09 MED ORDER — ESCITALOPRAM OXALATE 10 MG PO TABS
10.0000 mg | ORAL_TABLET | Freq: Every day | ORAL | 3 refills | Status: DC
Start: 2020-01-09 — End: 2020-01-09

## 2020-01-09 MED ORDER — FUROSEMIDE 20 MG PO TABS
20.0000 mg | ORAL_TABLET | Freq: Every day | ORAL | 3 refills | Status: DC
Start: 2020-01-09 — End: 2020-01-09

## 2020-01-09 MED ORDER — FUROSEMIDE 20 MG PO TABS
20.0000 mg | ORAL_TABLET | Freq: Every day | ORAL | 3 refills | Status: DC
Start: 2020-01-09 — End: 2021-01-10

## 2020-01-09 MED ORDER — ESCITALOPRAM OXALATE 10 MG PO TABS
10.0000 mg | ORAL_TABLET | Freq: Every day | ORAL | 3 refills | Status: DC
Start: 2020-01-09 — End: 2021-01-17

## 2020-01-09 MED ORDER — ROSUVASTATIN CALCIUM 20 MG PO TABS
20.0000 mg | ORAL_TABLET | Freq: Every day | ORAL | 3 refills | Status: DC
Start: 2020-01-09 — End: 2020-12-24

## 2020-01-09 MED ORDER — ROSUVASTATIN CALCIUM 20 MG PO TABS
20.0000 mg | ORAL_TABLET | Freq: Every day | ORAL | 3 refills | Status: DC
Start: 2020-01-09 — End: 2020-01-09

## 2020-01-09 NOTE — Patient Instructions (Signed)
Thanks for your effort.  Update me as needed.  Take care.  Glad to see you. 
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
## 2020-01-10 NOTE — Assessment & Plan Note (Signed)
Edema controlled with lasix.  No ADE on med.  Compliant.  Used daily.  No troubles note by patient.  No CP, SOB.  Would continue Lasix as is.

## 2020-01-10 NOTE — Assessment & Plan Note (Signed)
Labs discussed with patient.  Continue statin.  Continue work on diet and exercise.  I thanked her for her effort.
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
## 2020-01-10 NOTE — Assessment & Plan Note (Signed)
Flu shot 2021 Tdap 2015 zosta 2013 PNA up to date.   She is going to get her covid booster shot this Friday.   Mammogram due later this year. DXA wnl 2016. Okay to defer as of 2021 Colonoscopy 2015 Pap not due.  Diet and execrise d/w pt. Encouraged both.  Advance directive d/w pt. Husband designated if patient were incapacitated.

## 2020-01-10 NOTE — Assessment & Plan Note (Signed)
Advance directive- d/w pt. Husband designated if patient were incapacitated.  

## 2020-01-31 ENCOUNTER — Other Ambulatory Visit: Payer: Self-pay | Admitting: Family Medicine

## 2020-01-31 DIAGNOSIS — Z1231 Encounter for screening mammogram for malignant neoplasm of breast: Secondary | ICD-10-CM

## 2020-02-01 ENCOUNTER — Encounter (INDEPENDENT_AMBULATORY_CARE_PROVIDER_SITE_OTHER): Payer: Self-pay | Admitting: Ophthalmology

## 2020-02-01 ENCOUNTER — Ambulatory Visit (INDEPENDENT_AMBULATORY_CARE_PROVIDER_SITE_OTHER): Payer: Medicare Other | Admitting: Ophthalmology

## 2020-02-01 ENCOUNTER — Other Ambulatory Visit: Payer: Self-pay

## 2020-02-01 DIAGNOSIS — H35371 Puckering of macula, right eye: Secondary | ICD-10-CM | POA: Diagnosis not present

## 2020-02-01 DIAGNOSIS — H35372 Puckering of macula, left eye: Secondary | ICD-10-CM | POA: Diagnosis not present

## 2020-02-01 DIAGNOSIS — Z961 Presence of intraocular lens: Secondary | ICD-10-CM

## 2020-02-01 DIAGNOSIS — Z9889 Other specified postprocedural states: Secondary | ICD-10-CM

## 2020-02-01 NOTE — Assessment & Plan Note (Signed)
The nature of macular pucker (epiretinal membrane ERM) was discussed with the patient as well as threshold criteria for vitrectomy surgery. I explained that in rare cases another surgery is needed to actually remove a second wrinkle should it regrow.  Most often, the epiretinal membrane and underlying wrinkled internal limiting membrane are removed with the first surgery, to accomplish the goals.   If the operative eye is Phakic (natural lens still present), cataract surgery is often recommended prior to Vitrectomy. This will enable the retina surgeon to have the best view during surgery and the patient to obtain optimal results in the future. Treatment options were discussed. Minor and stable

## 2020-02-01 NOTE — Progress Notes (Signed)
02/01/2020     CHIEF COMPLAINT Patient presents for Retina Follow Up   HISTORY OF PRESENT ILLNESS: Tanya Espinoza is a 79 y.o. female who presents to the clinic today for:   HPI    Retina Follow Up    Patient presents with  Other.  In both eyes.  This started 1 year ago.  Severity is mild.  Duration of 1 year.  Since onset it is stable.          Comments    1 Year F/U OU,, via vitrectomy 1.5 years status post surgical repair macular pucker OD, visual acuity remained stable  Follow-up macular pucker left eye  Pt denies noticeable changes to [place] OU since last visit. Pt denies ocular pain, flashes of light, or floaters OU.         Last edited by Edmon Crape, MD on 02/01/2020 10:02 AM. (History)      Referring physician: Joaquim Nam, MD [address] [place],  [place] [address]  HISTORICAL INFORMATION:   Selected notes from the MEDICAL RECORD NUMBER       CURRENT MEDICATIONS: No current outpatient medications on file. (Ophthalmic Drugs)   No current facility-administered medications for this visit. (Ophthalmic Drugs)   Current Outpatient Medications (Other)  Medication Sig  . aspirin EC 81 MG tablet Take 81 mg by mouth daily.  . Calcium Carb-Cholecalciferol (CALCIUM + D3 PO) Take 1 tablet by mouth daily.  Marland Kitchen escitalopram (LEXAPRO) 10 MG tablet Take 1 tablet (10 mg total) by mouth daily.  . furosemide (LASIX) 20 MG tablet Take 1 tablet (20 mg total) by mouth daily.  Marland Kitchen GLUCOSAMINE HCL PO Take 2 tablets by mouth daily.  . Magnesium 400 MG TABS Take by mouth.  . Multiple Vitamins-Minerals (CENTRUM SILVER PO) Take 1 tablet by mouth daily.  . Omega-3 Fatty Acids (FISH OIL PO) Take 1 tablet by mouth daily.  . rosuvastatin (CRESTOR) 20 MG tablet Take 1 tablet (20 mg total) by mouth daily.  . TURMERIC PO Take 1 tablet by mouth daily.   No current facility-administered medications for this visit. (Other)      REVIEW OF SYSTEMS:    ALLERGIES No  Known Allergies  PAST MEDICAL HISTORY Past Medical History:  Diagnosis Date  . ASYMPTOMATIC POSTMENOPAUSAL STATUS 08/23/2007  . BRADYCARDIA 08/23/2007  . DEPRESSION 11/30/2006  . HYPERLIPIDEMIA 11/30/2006  . HYPERTENSION 11/30/2006  . PSORIASIS 11/30/2006  . UTI'S, HX OF 11/30/2006   Past Surgical History:  Procedure Laterality Date  . EYE SURGERY     cataracts bil.  Marland Kitchen VITRECTOMY Left 10/16/2018   25 Gauge membrane peel    FAMILY HISTORY Family History  Problem Relation Age of Onset  . Cancer Sister        Breast Cancer  . Stroke Mother   . Breast cancer Sister   . Colon cancer Neg Hx     SOCIAL HISTORY Social History   Tobacco Use  . Smoking status: Never Smoker  . Smokeless tobacco: Never Used  Vaping Use  . Vaping Use: Never used  Substance Use Topics  . Alcohol use: No  . Drug use: No         OPHTHALMIC EXAM:  Base Eye Exam    Visual Acuity (ETDRS)      Right Left   Dist [address] 20/40 20/20   Dist ph [place] 20/30        Tonometry (Tonopen, 8:59 AM)  Right Left   Pressure 14 14       Pupils      Pupils Dark Light Shape React APD   Right PERRL 4 3 Round Brisk None   Left PERRL 4 3 Round Brisk None       Visual Fields (Counting fingers)      Left Right    Full Full       Extraocular Movement      Right Left    Full Full       Neuro/Psych    Oriented x3: Yes   Mood/Affect: Normal       Dilation    Both eyes: 1.0% Mydriacyl, 2.5% Phenylephrine @ 9:02 AM        Slit Lamp and Fundus Exam    External Exam      Right Left   External Normal Normal       Slit Lamp Exam      Right Left   Lids/Lashes Normal Normal   Conjunctiva/Sclera White and quiet White and quiet   Cornea Clear Clear   Anterior Chamber Deep and quiet Deep and quiet   Iris Round and reactive Round and reactive   Lens Centered posterior chamber intraocular lens Centered posterior chamber intraocular lens   Anterior Vitreous Normal Normal       Fundus Exam      Right  Left   Posterior Vitreous Clear, vitrectomized Normal   Disc Normal Normal   C/D Ratio 0.3 0.3   Macula Normal Epiretinal membrane, moderate topographic distortion   Vessels Normal Normal   Periphery Normal Normal          IMAGING AND PROCEDURES  Imaging and Procedures for 02/01/20  OCT, Retina - OU - Both Eyes       Right Eye Quality was good. Scan locations included subfoveal. Central Foveal Thickness: 355. Progression has improved. Findings include abnormal foveal contour.   Left Eye Quality was good. Scan locations included subfoveal. Central Foveal Thickness: 361. Progression has been stable. Findings include epiretinal membrane.   Notes OS no foveal distortion from epiretinal membrane  Much less retinal thickening OD status post vitrectomy membrane peel 1.5 years ago, July 2020                ASSESSMENT/PLAN:  Right epiretinal membrane Macular thickening continues to improve, visual cue remained stable status post repair 1.5 years previous  Left epiretinal membrane The nature of macular pucker (epiretinal membrane ERM) was discussed with the patient as well as threshold criteria for vitrectomy surgery. I explained that in rare cases another surgery is needed to actually remove a second wrinkle should it regrow.  Most often, the epiretinal membrane and underlying wrinkled internal limiting membrane are removed with the first surgery, to accomplish the goals.   If the operative eye is Phakic (natural lens still present), cataract surgery is often recommended prior to Vitrectomy. This will enable the retina surgeon to have the best view during surgery and the patient to obtain optimal results in the future. Treatment options were discussed. Minor and stable      ICD-10-CM   1. Left epiretinal membrane  H35.372 OCT, Retina - OU - Both Eyes  2. Right epiretinal membrane  H35.371 OCT, Retina - OU - Both Eyes  3. Pseudophakia  Z96.1   4. History of vitrectomy   Z98.890     1.  2.  3.  Ophthalmic Meds Ordered this visit:  No orders of the defined types  were placed in this encounter.      Return in about 1 year (around 01/31/2021) for DILATE OU, OCT.  There are no Patient Instructions on file for this visit.   Explained the diagnoses, plan, and follow up with the patient and they expressed understanding.  Patient expressed understanding of the importance of proper follow up care.   Alford Highland Darice Vicario M.D. Diseases & Surgery of the Retina and Vitreous [facility] 02/01/20     Abbreviations: M myopia (nearsighted); A astigmatism; H hyperopia (farsighted); P presbyopia; Mrx spectacle prescription;  CTL contact lenses; OD right eye; OS left eye; OU both eyes  XT exotropia; ET esotropia; PEK punctate epithelial keratitis; PEE punctate epithelial erosions; DES dry eye syndrome; MGD meibomian gland dysfunction; ATs artificial tears; PFAT's preservative free artificial tears; NSC nuclear sclerotic cataract; PSC posterior subcapsular cataract; ERM epi-retinal membrane; PVD posterior vitreous detachment; RD retinal detachment; DM diabetes mellitus; DR diabetic retinopathy; NPDR non-proliferative diabetic retinopathy; PDR proliferative diabetic retinopathy; CSME clinically significant macular edema; DME diabetic macular edema; dbh dot blot hemorrhages; CWS cotton wool spot; POAG primary open angle glaucoma; C/D cup-to-disc ratio; HVF humphrey visual field; GVF goldmann visual field; OCT optical coherence tomography; IOP intraocular pressure; BRVO Branch retinal vein occlusion; CRVO central retinal vein occlusion; CRAO central retinal artery occlusion; BRAO branch retinal artery occlusion; RT retinal tear; SB scleral buckle; PPV pars plana vitrectomy; VH Vitreous hemorrhage; PRP panretinal laser photocoagulation; IVK intravitreal kenalog; VMT vitreomacular traction; MH Macular hole;  NVD neovascularization of the disc; NVE neovascularization  elsewhere; AREDS age related eye disease study; ARMD age related macular degeneration; POAG primary open angle glaucoma; EBMD epithelial/anterior basement membrane dystrophy; ACIOL anterior chamber intraocular lens; IOL intraocular lens; PCIOL posterior chamber intraocular lens; Phaco/IOL phacoemulsification with intraocular lens placement; PRK photorefractive keratectomy; LASIK laser assisted in situ keratomileusis; HTN hypertension; DM diabetes mellitus; COPD chronic obstructive pulmonary disease

## 2020-02-01 NOTE — Assessment & Plan Note (Signed)
Macular thickening continues to improve, visual cue remained stable status post repair 1.5 years previous

## 2020-03-14 ENCOUNTER — Other Ambulatory Visit: Payer: Self-pay

## 2020-03-14 ENCOUNTER — Ambulatory Visit
Admission: RE | Admit: 2020-03-14 | Discharge: 2020-03-14 | Disposition: A | Discharge status: Home / Self Care | Payer: Medicare Other | Source: Ambulatory Visit | Attending: Family Medicine | Admitting: Family Medicine

## 2020-03-14 DIAGNOSIS — Z1231 Encounter for screening mammogram for malignant neoplasm of breast: Secondary | ICD-10-CM

## 2020-03-27 ENCOUNTER — Telehealth: Payer: Self-pay

## 2020-03-27 NOTE — Telephone Encounter (Signed)
Pt would like for someone to give her a call regarding an invoice she received from [place] regarding her annual visit on 01/09/20. She said she called the billing office and they told her she needs to call us to get this fixed. She said she do not understand why she was charged this amount for a preventative visit. She would like a call back as soon as possible.

## 2020-04-11 NOTE — Telephone Encounter (Signed)
Discussed with patient. She is going to email over a copy of her bill for me to investigate further

## 2020-04-11 NOTE — Telephone Encounter (Signed)
Lvm asking pt to return my call

## 2020-04-17 NOTE — Telephone Encounter (Signed)
Pt called in and she stated that she is still out of town and wanted the update of the billing.

## 2020-04-26 NOTE — Telephone Encounter (Signed)
I called and discussed this with patient. I let her know that I am still investigating this and will give her a call back. She verbalized understanding and thanked me for giving her a call back.

## 2020-05-23 NOTE — Telephone Encounter (Signed)
This has been forwarded over to the billing department for review. Patient advised.

## 2020-11-22 ENCOUNTER — Encounter: Payer: Self-pay | Admitting: Emergency Medicine

## 2020-11-22 ENCOUNTER — Other Ambulatory Visit: Payer: Self-pay

## 2020-11-22 ENCOUNTER — Ambulatory Visit
Admission: EM | Admit: 2020-11-22 | Discharge: 2020-11-22 | Disposition: A | Discharge status: Home / Self Care | Payer: Medicare Other | Attending: Physician Assistant | Admitting: Physician Assistant

## 2020-11-22 DIAGNOSIS — R059 Cough, unspecified: Secondary | ICD-10-CM

## 2020-11-22 DIAGNOSIS — U071 COVID-19: Secondary | ICD-10-CM | POA: Diagnosis not present

## 2020-11-22 MED ORDER — NIRMATRELVIR/RITONAVIR (PAXLOVID)TABLET: 3.0000 | ORAL_TABLET | Freq: Two times a day (BID) | ORAL | 0 refills | Status: DC

## 2020-11-22 MED ORDER — PROMETHAZINE-DM 6.25-15 MG/5ML PO SYRP: 5.0000 mL | ORAL_SOLUTION | Freq: Two times a day (BID) | ORAL | 0 refills | Status: DC | PRN

## 2020-11-22 MED ORDER — NIRMATRELVIR/RITONAVIR (PAXLOVID) TABLET (RENAL DOSING): 2.0000 | ORAL_TABLET | Freq: Two times a day (BID) | ORAL | 0 refills | Status: AC

## 2020-11-22 NOTE — Discharge Instructions (Addendum)
We have called in the oral antiviral to help prevent severe illness and hospitalization due to COVID.  Please take this as prescribed.  We do not need to adjust the dose based on your last metabolic panel that was approximately 11 months ago but if the metabolic panel that we drew today indicate we need to adjust the dose we will contact you.  WHILE YOU ARE ON THIS MEDICATION PLEASE DO NOT TAKE YOUR ROSUVASTATIN AND WAIT 5 DAYS AFTER FINISHING IT TO RESTART IT. Please rest and drink plenty of fluid.  The cough medicine can make you sleepy do not drive or drink alcohol while taking it.  If you have any severe symptoms including high fever, shortness of breath, chest pain you need to go to the emergency room.  Please follow-up with either our clinic or your primary care provider within a week to ensure symptom improvement.

## 2020-11-22 NOTE — ED Provider Notes (Signed)
Tanya Espinoza    CSN: [id number] Arrival date & time: 11/22/20  1646      History   Chief Complaint Chief Complaint  Patient presents with   Covid Positive    HPI Tanya Espinoza is a 80 y.o. female.   Patient presents today with a several day history of cough.  Reports that her husband had similar but worse symptoms that tested positive for COVID-19 approximately 1 day ago.  She then took another at home test and tested positive earlier today.  She denies any additional symptoms including fever, shortness of breath, nausea, vomiting, diarrhea, chest pain, headaches, dizziness.  She has not tried any over-the-counter medications for symptom management.  She does report that she has had COVID-19 vaccination series as well as 2 boosters.  Denies any significant past medical history including diabetes, chronic liver/kidney disease, history of smoking, chronic lung disease, immunosuppression, malignancy.  She did contact primary Espinoza provider regarding husband and he was prescribed baclofen and Promethazine DM.  She is interested in starting these medications for self but cannot arrange in person or video visit with PCP office prompting evaluation by our clinic today.   Past Medical History:  Diagnosis Date   ASYMPTOMATIC POSTMENOPAUSAL STATUS 08/23/2007   BRADYCARDIA 08/23/2007   DEPRESSION 11/30/2006   HYPERLIPIDEMIA 11/30/2006   HYPERTENSION 11/30/2006   PSORIASIS 11/30/2006   UTI'S, HX OF 11/30/2006    Patient Active Problem List   Diagnosis Date Noted   Right epiretinal membrane 02/01/2020   Pseudophakia 02/01/2020   History of vitrectomy 02/01/2020   Left epiretinal membrane 02/01/2020   Healthcare maintenance 12/25/2016   Advance Espinoza planning 12/25/2016   Skin lesion 12/25/2016   Medicare annual wellness visit, subsequent 10/24/2012   EDEMA 10/02/2009   Dyslipidemia 11/30/2006   Depression, recurrent (HCC) 11/30/2006   PSORIASIS 11/30/2006    Past Surgical  History:  Procedure Laterality Date   EYE SURGERY     cataracts bil.   VITRECTOMY Left 10/16/2018   25 Gauge membrane peel    OB History   No obstetric history on file.      Home Medications    Prior to Admission medications   Medication Sig Start Date End Date Taking? Authorizing Provider  aspirin EC 81 MG tablet Take 81 mg by mouth daily.   Yes [provider]  Calcium Carb-Cholecalciferol (CALCIUM + D3 PO) Take 1 tablet by mouth daily.   Yes [provider]  escitalopram (LEXAPRO) 10 MG tablet Take 1 tablet (10 mg total) by mouth daily. 01/09/20  Yes Joaquim Nam, MD  furosemide (LASIX) 20 MG tablet Take 1 tablet (20 mg total) by mouth daily. 01/09/20  Yes Joaquim Nam, MD  GLUCOSAMINE HCL PO Take 2 tablets by mouth daily.   Yes [provider]  Magnesium 400 MG TABS Take by mouth.   Yes [provider]  Multiple Vitamins-Minerals (CENTRUM SILVER PO) Take 1 tablet by mouth daily.   Yes [provider]  nirmatrelvir/ritonavir EUA, renal dosing, (PAXLOVID) 10 x 150 MG & 10 x 100MG  TABS Take 2 tablets by mouth 2 (two) times daily for 5 days. Patient GFR is 61.02 mL/min. Take nirmatrelvir (150 mg) one tablet twice daily for 5 days and ritonavir (100 mg) one tablet twice daily for 5 days. 11/22/20 11/27/20 Yes Ziona Wickens K, PA-C  Omega-3 Fatty Acids (FISH OIL PO) Take 1 tablet by mouth daily.   Yes [provider]  promethazine-dextromethorphan (PROMETHAZINE-DM) 6.25-15 MG/5ML syrup  Take 5 mLs by mouth 2 (two) times daily as needed for cough. 11/22/20  Yes Jalasia Eskridge K, PA-C  rosuvastatin (CRESTOR) 20 MG tablet Take 1 tablet (20 mg total) by mouth daily. 01/09/20  Yes Joaquim Namuncan, Graham S, MD  TURMERIC PO Take 1 tablet by mouth daily.   Yes [provider]    Family History Family History  Problem Relation Age of Onset   Cancer Sister        Breast Cancer   Stroke Mother    Breast cancer Sister    Colon cancer Neg  Hx     Social History Social History   Tobacco Use   Smoking status: Never   Smokeless tobacco: Never  Vaping Use   Vaping Use: Never used  Substance Use Topics   Alcohol use: No   Drug use: No     Allergies   Patient has no known allergies.   Review of Systems Review of Systems  Constitutional:  Negative for activity change, appetite change, fatigue and fever.  HENT:  Negative for congestion, sinus pressure, sneezing and sore throat.   Respiratory:  Positive for cough. Negative for shortness of breath.   Cardiovascular:  Negative for chest pain.  Gastrointestinal:  Negative for abdominal pain, diarrhea, nausea and vomiting.  Musculoskeletal:  Negative for arthralgias and myalgias.  Neurological:  Negative for dizziness, light-headedness and headaches.    Physical Exam Triage Vital Signs ED Triage Vitals  Enc Vitals Group     BP 11/22/20 1726 122/71     Pulse Rate 11/22/20 1726 62     Resp --      Temp 11/22/20 1726 98.6 F (37 C)     Temp Source 11/22/20 1726 Oral     SpO2 11/22/20 1726 97 %     Weight 11/22/20 1727 136 lb (61.7 kg)     Height 11/22/20 1727 5\' 1"  (1.549 m)     Head Circumference --      Peak Flow --      Pain Score 11/22/20 1727 0     Pain Loc --      Pain Edu? --      Excl. in GC? --    No data found.  Updated Vital Signs BP 122/71 (BP Location: Left Arm)   Pulse 62   Temp 98.6 F (37 C) (Oral)   Ht 5\' 1"  (1.549 m)   Wt 136 lb (61.7 kg)   SpO2 97%   BMI 25.70 kg/m   Visual Acuity Right Eye Distance:   Left Eye Distance:   Bilateral Distance:    Right Eye Near:   Left Eye Near:    Bilateral Near:     Physical Exam Vitals reviewed.  Constitutional:      General: She is awake. She is not in acute distress.    Appearance: Normal appearance. She is normal weight. She is not ill-appearing.     Comments: Very pleasant female appears stated age no acute distress sitting comfortably in exam room  HENT:     Head: Normocephalic  and atraumatic.     Right Ear: Tympanic membrane, ear canal and external ear normal. Tympanic membrane is not erythematous or bulging.     Left Ear: Tympanic membrane, ear canal and external ear normal. Tympanic membrane is not erythematous or bulging.     Nose:     Right Sinus: No maxillary sinus tenderness or frontal sinus tenderness.     Left Sinus: No maxillary sinus tenderness  or frontal sinus tenderness.     Mouth/Throat:     Pharynx: Uvula midline. Posterior oropharyngeal erythema present. No oropharyngeal exudate.  Cardiovascular:     Rate and Rhythm: Normal rate and regular rhythm.     Heart sounds: Normal heart sounds, S1 normal and S2 normal. No murmur heard. Pulmonary:     Effort: Pulmonary effort is normal.     Breath sounds: Normal breath sounds. No wheezing, rhonchi or rales.     Comments: Clear to auscultation bilaterally Psychiatric:        Behavior: Behavior is cooperative.     UC Treatments / Results  Labs (all labs ordered are listed, but only abnormal results are displayed) Labs Reviewed  BASIC METABOLIC PANEL    EKG   Radiology No results found.  Procedures Procedures (including critical Espinoza time)  Medications Ordered in UC Medications - No data to display  Initial Impression / Assessment and Plan / UC Course  I have reviewed the triage vital signs and the nursing notes.  Pertinent labs & imaging results that were available during my Espinoza of the patient were reviewed by me and considered in my medical decision making (see chart for details).      Patient qualifies for oral antiviral medications due to advanced age.  We discussed risks and benefits of these medications the patient is interested in starting Paxlovid and last metabolic panel available in [facility] was from 12/25/2019 with creatinine of 0.74 and calculated creatinine clearance 61.02 mL/min.  We will update BMP today but go ahead and start Paxlovid at renal dosing given she is  close to the cutoff and lab work is approximately 19-year-old.  She was instructed to hold statin while on this medication and restart it 5 days after completing course of medication.  She was given Promethazine DM for cough with instructed to drive drink alcohol with taking this medication.  She can use over-the-counter medications including Tylenol, Mucinex, Flonase for symptom relief.  Recommend she rest and drink plenty of fluid.  Discussed that if she has any worsening symptoms she needs to be reevaluated.  Follow-up with primary Espinoza provider within a week to ensure symptom improvement.  Strict return precautions given to which patient expressed understanding.  Final Clinical Impressions(s) / UC Diagnoses   Final diagnoses:  COVID-19  Cough     Discharge Instructions      We have called in the oral antiviral to help prevent severe illness and hospitalization due to COVID.  Please take this as prescribed.  We do not need to adjust the dose based on your last metabolic panel that was approximately 11 months ago but if the metabolic panel that we drew today indicate we need to adjust the dose we will contact you.  WHILE YOU ARE ON THIS MEDICATION PLEASE DO NOT TAKE YOUR ROSUVASTATIN AND WAIT 5 DAYS AFTER FINISHING IT TO RESTART IT. Please rest and drink plenty of fluid.  The cough medicine can make you sleepy do not drive or drink alcohol while taking it.  If you have any severe symptoms including high fever, shortness of breath, chest pain you need to go to the emergency room.  Please follow-up with either our clinic or your primary Espinoza provider within a week to ensure symptom improvement.       ED Prescriptions     Medication Sig Dispense Auth. Provider   nirmatrelvir/ritonavir EUA (PAXLOVID) 20 x 150 MG & 10 x 100MG  TABS  (Status: Discontinued) Take 3 tablets  by mouth 2 (two) times daily for 5 days. Patient GFR is 70.34 mL/min. Take nirmatrelvir (150 mg) two tablets twice daily for 5 days  and ritonavir (100 mg) one tablet twice daily for 5 days. 30 tablet Lisa Milian K, PA-C   promethazine-dextromethorphan (PROMETHAZINE-DM) 6.25-15 MG/5ML syrup Take 5 mLs by mouth 2 (two) times daily as needed for cough. 118 mL Lisle Skillman K, PA-C   nirmatrelvir/ritonavir EUA, renal dosing, (PAXLOVID) 10 x 150 MG & 10 x 100MG  TABS Take 2 tablets by mouth 2 (two) times daily for 5 days. Patient GFR is 61.02 mL/min. Take nirmatrelvir (150 mg) one tablet twice daily for 5 days and ritonavir (100 mg) one tablet twice daily for 5 days. 20 tablet Kimbley Sprague, , PA-C      PDMP not reviewed this encounter.   Noberto Retort, PA-C 11/22/20 1756

## 2020-11-22 NOTE — ED Triage Notes (Signed)
Patient states that she tested positive this morning at home for COVID.  Patient has had a cough all week.  Patient's husband has COVID as well.  Patient is vaccinated for COVID.

## 2020-11-23 LAB — BASIC METABOLIC PANEL
BUN/Creatinine Ratio: 21 (ref 12–28)
BUN: 17 mg/dL (ref 8–27)
CO2: 21 mmol/L (ref 20–29)
Calcium: 9.5 mg/dL (ref 8.7–10.3)
Chloride: 104 mmol/L (ref 96–106)
Creatinine, Ser: 0.81 mg/dL (ref 0.57–1.00)
Glucose: 81 mg/dL (ref 65–99)
Potassium: 4.4 mmol/L (ref 3.5–5.2)
Sodium: 140 mmol/L (ref 134–144)
eGFR: 74 mL/min/{1.73_m2} (ref >59)

## 2020-12-21 ENCOUNTER — Other Ambulatory Visit: Payer: Self-pay | Admitting: Family Medicine

## 2021-01-09 ENCOUNTER — Other Ambulatory Visit: Payer: Self-pay | Admitting: Family Medicine

## 2021-01-10 ENCOUNTER — Ambulatory Visit: Payer: Medicare Other

## 2021-01-13 ENCOUNTER — Other Ambulatory Visit: Payer: Medicare Other

## 2021-01-17 ENCOUNTER — Other Ambulatory Visit: Payer: Self-pay | Admitting: Family Medicine

## 2021-01-17 ENCOUNTER — Ambulatory Visit (INDEPENDENT_AMBULATORY_CARE_PROVIDER_SITE_OTHER): Payer: Medicare Other | Admitting: Family Medicine

## 2021-01-17 ENCOUNTER — Encounter: Payer: Self-pay | Admitting: Family Medicine

## 2021-01-17 ENCOUNTER — Other Ambulatory Visit: Payer: Self-pay

## 2021-01-17 VITALS — BP 118/80 | HR 76 | Temp 98.0°F | Ht 60.0 in | Wt 139.0 lb

## 2021-01-17 DIAGNOSIS — Z0000 Encounter for general adult medical examination without abnormal findings: Secondary | ICD-10-CM

## 2021-01-17 DIAGNOSIS — Z780 Asymptomatic menopausal state: Secondary | ICD-10-CM | POA: Diagnosis not present

## 2021-01-17 DIAGNOSIS — R609 Edema, unspecified: Secondary | ICD-10-CM

## 2021-01-17 DIAGNOSIS — E785 Hyperlipidemia, unspecified: Secondary | ICD-10-CM

## 2021-01-17 DIAGNOSIS — Z7189 Other specified counseling: Secondary | ICD-10-CM

## 2021-01-17 DIAGNOSIS — F339 Major depressive disorder, recurrent, unspecified: Secondary | ICD-10-CM | POA: Diagnosis not present

## 2021-01-17 DIAGNOSIS — Z1231 Encounter for screening mammogram for malignant neoplasm of breast: Secondary | ICD-10-CM

## 2021-01-17 LAB — CBC WITH DIFFERENTIAL/PLATELET
Basophils Absolute: 0 10*3/uL (ref 0.0–0.1)
Basophils Relative: 0.3 % (ref 0.0–3.0)
Eosinophils Absolute: 0.1 10*3/uL (ref 0.0–0.7)
Eosinophils Relative: 1.5 % (ref 0.0–5.0)
HCT: 37 % (ref 36.0–46.0)
Hemoglobin: 12.3 g/dL (ref 12.0–15.0)
Lymphocytes Relative: 21.5 % (ref 12.0–46.0)
Lymphs Abs: 1.6 10*3/uL (ref 0.7–4.0)
MCHC: 33.3 g/dL (ref 30.0–36.0)
MCV: 88.2 fl (ref 78.0–100.0)
Monocytes Absolute: 0.4 10*3/uL (ref 0.1–1.0)
Monocytes Relative: 5.8 % (ref 3.0–12.0)
Neutro Abs: 5.4 10*3/uL (ref 1.4–7.7)
Neutrophils Relative %: 70.9 % (ref 43.0–77.0)
Platelets: 189 10*3/uL (ref 150.0–400.0)
RBC: 4.19 Mil/uL (ref 3.87–5.11)
RDW: 14.9 % (ref 11.5–15.5)
WBC: 7.7 10*3/uL (ref 4.0–10.5)

## 2021-01-17 LAB — COMPREHENSIVE METABOLIC PANEL
ALT: 18 U/L (ref 0–35)
AST: 24 U/L (ref 0–37)
Albumin: 4.3 g/dL (ref 3.5–5.2)
Alkaline Phosphatase: 45 U/L (ref 39–117)
BUN: 17 mg/dL (ref 6–23)
CO2: 29 mEq/L (ref 19–32)
Calcium: 9.4 mg/dL (ref 8.4–10.5)
Chloride: 108 mEq/L (ref 96–112)
Creatinine, Ser: 0.78 mg/dL (ref 0.40–1.20)
GFR: 72.81 mL/min (ref >60.00)
Glucose, Bld: 87 mg/dL (ref 70–99)
Potassium: 4.1 mEq/L (ref 3.5–5.1)
Sodium: 143 mEq/L (ref 135–145)
Total Bilirubin: 0.5 mg/dL (ref 0.2–1.2)
Total Protein: 6.7 g/dL (ref 6.0–8.3)

## 2021-01-17 LAB — LIPID PANEL
Cholesterol: 156 mg/dL (ref 0–200)
HDL: 72.1 mg/dL (ref >39.00)
LDL Cholesterol: 71 mg/dL (ref 0–99)
NonHDL: 83.89
Total CHOL/HDL Ratio: 2
Triglycerides: 62 mg/dL (ref 0.0–149.0)
VLDL: 12.4 mg/dL (ref 0.0–40.0)

## 2021-01-17 LAB — TSH: TSH: 1.2 u[IU]/mL (ref 0.35–5.50)

## 2021-01-17 MED ORDER — FUROSEMIDE 20 MG PO TABS: 20.0000 mg | ORAL_TABLET | Freq: Every day | ORAL | 3 refills | Status: DC

## 2021-01-17 MED ORDER — ESCITALOPRAM OXALATE 10 MG PO TABS: 10.0000 mg | ORAL_TABLET | Freq: Every day | ORAL | 3 refills | Status: DC

## 2021-01-17 MED ORDER — ROSUVASTATIN CALCIUM 20 MG PO TABS: 20.0000 mg | ORAL_TABLET | Freq: Every day | ORAL | 3 refills | Status: DC

## 2021-01-17 NOTE — Progress Notes (Signed)
This visit occurred during the SARS-CoV-2 public health emergency.  Safety protocols were in place, including screening questions prior to the visit, additional usage of staff PPE, and extensive cleaning of exam room while observing appropriate contact time as indicated for disinfecting solutions.  I have personally reviewed the Medicare Annual Wellness questionnaire and have noted 1. The patient's medical and social history 2. Their use of alcohol, tobacco or illicit drugs 3. Their current medications and supplements 4. The patient's functional ability including ADL's, fall risks, home safety risks and hearing or visual             impairment. 5. Diet and physical activities 6. Evidence for depression or mood disorders  The patients weight, height, BMI have been recorded in the chart and visual acuity is per eye clinic.  I have made referrals, counseling and provided education to the patient based review of the above and I have provided the pt with a written personalized care plan for preventive services.  Provider list updated- see scanned forms.  Routine anticipatory guidance given to patient.  See health maintenance. The possibility exists that previously documented standard health maintenance information may have been brought forward from a previous encounter into this note.  If needed, that same information has been updated to reflect the current situation based on today's encounter.    Flu shot 2022 Tdap 2015 Zostavax 2013 PNA up to date.   covid booster prev done.   Mammogram done 02/2020 DXA wnl 2016.  Ordered 2022 Colonoscopy 2015 Pap not due.   Diet and execrise d/w pt.  Encouraged both.   Advance directive d/w pt.  Husband designated if patient were incapacitated.    In addition to [facility], follow up visit for the below conditions:  She had covid but recovered after relatively mild case.  D/w pt.    Mood d/w pt.  Mood is still good.  Still on lexapro.  Compliant.   D/w pt about options- has been on med for years.  She wanted to continue.  Elevated Cholesterol: Using medications without problems: yes Muscle aches: no Diet compliance: yes Exercise: yes  H/o edema.  L>R ankle puffiness. Still taking lasix at baseline.  Edema resolved by the next AM.    PMH and SH reviewed  Meds, vitals, and allergies reviewed.   ROS: Per HPI.  Unless specifically indicated otherwise in HPI, the patient denies:  General: fever. Eyes: acute vision changes ENT: sore throat Cardiovascular: chest pain Respiratory: SOB GI: vomiting GU: dysuria Musculoskeletal: acute back pain Derm: acute rash Neuro: acute motor dysfunction Psych: worsening mood Endocrine: polydipsia Heme: bleeding Allergy: hayfever  GEN: nad, alert and oriented HEENT: ncat NECK: supple w/o LA CV: rrr. PULM: ctab, no inc wob ABD: soft, +bs EXT: no edema SKIN: Well-perfused.

## 2021-01-17 NOTE — Patient Instructions (Signed)
Don't change your meds for now. Go to the lab on the way out.   If you have mychart we'll likely use that to update you.    Take care.  Glad to see you. 

## 2021-01-19 NOTE — Assessment & Plan Note (Signed)
Continue work on diet and exercise.  Continue Crestor.  See notes on labs.

## 2021-01-19 NOTE — Assessment & Plan Note (Signed)
Mood is still good.  Still on lexapro.  Compliant.  D/w pt about options- has been on med for years.  She wanted to continue.

## 2021-01-19 NOTE — Assessment & Plan Note (Signed)
No edema on exam.  Would continue Lasix as is.  See notes on labs.

## 2021-01-19 NOTE — Assessment & Plan Note (Signed)
Flu shot 2022 Tdap 2015 Zostavax 2013 PNA up to date.   covid booster prev done.   Mammogram done 02/2020 DXA wnl 2016.  Ordered 2022 Colonoscopy 2015 Pap not due.   Diet and execrise d/w pt.  Encouraged both.   Advance directive d/w pt.  Husband designated if patient were incapacitated.

## 2021-01-19 NOTE — Assessment & Plan Note (Signed)
Advance directive- d/w pt. Husband designated if patient were incapacitated.  

## 2021-02-03 ENCOUNTER — Encounter (INDEPENDENT_AMBULATORY_CARE_PROVIDER_SITE_OTHER): Payer: Self-pay | Admitting: Ophthalmology

## 2021-02-03 ENCOUNTER — Other Ambulatory Visit: Payer: Self-pay

## 2021-02-03 ENCOUNTER — Ambulatory Visit (INDEPENDENT_AMBULATORY_CARE_PROVIDER_SITE_OTHER): Payer: Medicare Other | Admitting: Ophthalmology

## 2021-02-03 DIAGNOSIS — H35371 Puckering of macula, right eye: Secondary | ICD-10-CM

## 2021-02-03 DIAGNOSIS — H35372 Puckering of macula, left eye: Secondary | ICD-10-CM

## 2021-02-03 NOTE — Assessment & Plan Note (Signed)
No recurrence of epiretinal membrane OD, minor thickening remains improved overall

## 2021-02-03 NOTE — Progress Notes (Signed)
02/03/2021     CHIEF COMPLAINT Patient presents for  Chief Complaint  Patient presents with   Retina Follow Up      HISTORY OF PRESENT ILLNESS: Tanya Espinoza is a 80 y.o. female who presents to the clinic today for:   HPI     Retina Follow Up   Patient presents with  Other.  In both eyes.  This started 1 year ago.  Severity is mild.  Duration of 1 year.  Since onset it is stable.        Comments   1 yr fu OU oct. Patient states vision is stable and unchanged since last visit. Denies any new floaters or FOL.       Last edited by Nelva Nay on 02/03/2021  8:59 AM.      Referring physician: Ernesto Rutherford, MD [address] [place],  [place] [address]  HISTORICAL INFORMATION:   Selected notes from the MEDICAL RECORD NUMBER       CURRENT MEDICATIONS: No current outpatient medications on file. (Ophthalmic Drugs)   No current facility-administered medications for this visit. (Ophthalmic Drugs)   Current Outpatient Medications (Other)  Medication Sig   aspirin EC 81 MG tablet Take 81 mg by mouth daily.   Calcium Carb-Cholecalciferol (CALCIUM + D3 PO) Take 1 tablet by mouth daily.   escitalopram (LEXAPRO) 10 MG tablet Take 1 tablet (10 mg total) by mouth daily.   furosemide (LASIX) 20 MG tablet Take 1 tablet (20 mg total) by mouth daily.   GLUCOSAMINE HCL PO Take 2 tablets by mouth daily.   Magnesium 400 MG TABS Take by mouth.   Multiple Vitamins-Minerals (CENTRUM SILVER PO) Take 1 tablet by mouth daily.   Omega-3 Fatty Acids (FISH OIL PO) Take 1 tablet by mouth daily.   rosuvastatin (CRESTOR) 20 MG tablet Take 1 tablet (20 mg total) by mouth daily.   TURMERIC PO Take 1 tablet by mouth daily.   No current facility-administered medications for this visit. (Other)      REVIEW OF SYSTEMS:    ALLERGIES No Known Allergies  PAST MEDICAL HISTORY Past Medical History:  Diagnosis Date   ASYMPTOMATIC POSTMENOPAUSAL STATUS 08/23/2007   BRADYCARDIA  08/23/2007   DEPRESSION 11/30/2006   HYPERLIPIDEMIA 11/30/2006   HYPERTENSION 11/30/2006   PSORIASIS 11/30/2006   UTI'S, HX OF 11/30/2006   Past Surgical History:  Procedure Laterality Date   EYE SURGERY     cataracts bil.   VITRECTOMY Left 10/16/2018   25 Gauge membrane peel    FAMILY HISTORY Family History  Problem Relation Age of Onset   Cancer Sister        Breast Cancer   Stroke Mother    Breast cancer Sister    Colon cancer Neg Hx     SOCIAL HISTORY Social History   Tobacco Use   Smoking status: Never   Smokeless tobacco: Never  Vaping Use   Vaping Use: Never used  Substance Use Topics   Alcohol use: No   Drug use: No         OPHTHALMIC EXAM:  Base Eye Exam     Visual Acuity (ETDRS)       Right Left   Dist [place] 20/40 -2 20/30 +2   Dist ph [place] NI          Tonometry (Tonopen, 9:02 AM)       Right Left   Pressure 13 10  Pupils       Dark Light Shape React APD   Right   Irregular Minimal None   Left 4 3 Round Brisk None         Visual Fields (Counting fingers)       Left Right    Full Full         Extraocular Movement       Right Left    Full Full         Neuro/Psych     Oriented x3: Yes   Mood/Affect: Normal         Dilation     Both eyes: 1.0% Mydriacyl, 2.5% Phenylephrine @ 9:02 AM           Slit Lamp and Fundus Exam     External Exam       Right Left   External Normal Normal         Slit Lamp Exam       Right Left   Lids/Lashes Normal Normal   Conjunctiva/Sclera White and quiet White and quiet   Cornea Clear Clear   Anterior Chamber Deep and quiet Deep and quiet   Iris Round and reactive Round and reactive   Lens Centered posterior chamber intraocular lens Centered posterior chamber intraocular lens   Anterior Vitreous Normal Normal         Fundus Exam       Right Left   Posterior Vitreous Clear, vitrectomized Normal   Disc Normal Normal   C/D Ratio 0.3 0.3   Macula Normal  Epiretinal membrane, moderate topographic distortion   Vessels Normal Normal   Periphery Normal Normal            IMAGING AND PROCEDURES  Imaging and Procedures for 02/03/21  OCT, Retina - OU - Both Eyes       Right Eye Quality was good. Scan locations included subfoveal. Central Foveal Thickness: 351. Progression has improved. Findings include abnormal foveal contour.   Left Eye Quality was good. Scan locations included subfoveal. Central Foveal Thickness: 358. Progression has improved. Findings include epiretinal membrane.   Notes OS no foveal distortion from epiretinal membrane  Much less retinal thickening OD status post vitrectomy membrane peel 1.5 years ago, July 2020             ASSESSMENT/PLAN:  Left epiretinal membrane OS with moderate topographic distortion from epiretinal membrane no acuity impact, good foveal depression no outer retinal changes will observe  Right epiretinal membrane No recurrence of epiretinal membrane OD, minor thickening remains improved overall     ICD-10-CM   1. Left epiretinal membrane  H35.372 OCT, Retina - OU - Both Eyes    2. Right epiretinal membrane  H35.371 OCT, Retina - OU - Both Eyes      1.  OD looks great post vitrectomy membrane peel July 2020 2.  OS with mild epiretinal membrane no impact on acuity will continue to observe  3.  Ophthalmic Meds Ordered this visit:  No orders of the defined types were placed in this encounter.      Return in about 1 year (around 02/03/2022) for DILATE OU, OCT, COLOR FP.  There are no Patient Instructions on file for this visit.   Explained the diagnoses, plan, and follow up with the patient and they expressed understanding.  Patient expressed understanding of the importance of proper follow up care.   Alford Highland Joban Colledge M.D. Diseases & Surgery of the Retina and Vitreous [facility] 02/03/21     Abbreviations: M myopia (nearsighted); A astigmatism; H  hyperopia (farsighted); P presbyopia; Mrx spectacle prescription;  CTL contact lenses; OD right eye; OS left eye; OU both eyes  XT exotropia; ET esotropia; PEK punctate epithelial keratitis; PEE punctate epithelial erosions; DES dry eye syndrome; MGD meibomian gland dysfunction; ATs artificial tears; PFAT's preservative free artificial tears; NSC nuclear sclerotic cataract; PSC posterior subcapsular cataract; ERM epi-retinal membrane; PVD posterior vitreous detachment; RD retinal detachment; DM diabetes mellitus; DR diabetic retinopathy; NPDR non-proliferative diabetic retinopathy; PDR proliferative diabetic retinopathy; CSME clinically significant macular edema; DME diabetic macular edema; dbh dot blot hemorrhages; CWS cotton wool spot; POAG primary open angle glaucoma; C/D cup-to-disc ratio; HVF humphrey visual field; GVF goldmann visual field; OCT optical coherence tomography; IOP intraocular pressure; BRVO Branch retinal vein occlusion; CRVO central retinal vein occlusion; CRAO central retinal artery occlusion; BRAO branch retinal artery occlusion; RT retinal tear; SB scleral buckle; PPV pars plana vitrectomy; VH Vitreous hemorrhage; PRP panretinal laser photocoagulation; IVK intravitreal kenalog; VMT vitreomacular traction; MH Macular hole;  NVD neovascularization of the disc; NVE neovascularization elsewhere; AREDS age related eye disease study; ARMD age related macular degeneration; POAG primary open angle glaucoma; EBMD epithelial/anterior basement membrane dystrophy; ACIOL anterior chamber intraocular lens; IOL intraocular lens; PCIOL posterior chamber intraocular lens; Phaco/IOL phacoemulsification with intraocular lens placement; PRK photorefractive keratectomy; LASIK laser assisted in situ keratomileusis; HTN hypertension; DM diabetes mellitus; COPD chronic obstructive pulmonary disease

## 2021-02-03 NOTE — Assessment & Plan Note (Signed)
OS with moderate topographic distortion from epiretinal membrane no acuity impact, good foveal depression no outer retinal changes will observe

## 2021-02-09 ENCOUNTER — Other Ambulatory Visit: Payer: Self-pay | Admitting: Family Medicine

## 2021-02-11 ENCOUNTER — Ambulatory Visit: Payer: Medicare Other

## 2021-03-14 ENCOUNTER — Other Ambulatory Visit: Payer: Self-pay | Admitting: Family Medicine

## 2021-03-14 DIAGNOSIS — E2839 Other primary ovarian failure: Secondary | ICD-10-CM

## 2021-03-20 ENCOUNTER — Other Ambulatory Visit: Payer: Self-pay

## 2021-03-20 ENCOUNTER — Ambulatory Visit
Admission: RE | Admit: 2021-03-20 | Discharge: 2021-03-20 | Disposition: A | Discharge status: Home / Self Care | Payer: Medicare Other | Source: Ambulatory Visit | Attending: Family Medicine | Admitting: Family Medicine

## 2021-03-20 DIAGNOSIS — Z1231 Encounter for screening mammogram for malignant neoplasm of breast: Secondary | ICD-10-CM

## 2021-03-20 DIAGNOSIS — E2839 Other primary ovarian failure: Secondary | ICD-10-CM

## 2021-03-23 ENCOUNTER — Encounter: Payer: Self-pay | Admitting: Family Medicine

## 2021-03-24 ENCOUNTER — Telehealth: Payer: Self-pay | Admitting: Family Medicine

## 2021-03-24 NOTE — Telephone Encounter (Signed)
Spoke with patient and scheduled appt. See results note.

## 2021-03-24 NOTE — Telephone Encounter (Signed)
Patient has called stating she got her results back from her bone density but would like for someone to call and go over the results with her.

## 2021-04-01 ENCOUNTER — Other Ambulatory Visit: Payer: Self-pay

## 2021-04-01 ENCOUNTER — Telehealth (INDEPENDENT_AMBULATORY_CARE_PROVIDER_SITE_OTHER): Payer: Medicare Other | Admitting: Family Medicine

## 2021-04-01 VITALS — Ht 60.0 in | Wt 139.0 lb

## 2021-04-01 DIAGNOSIS — M810 Age-related osteoporosis without current pathological fracture: Secondary | ICD-10-CM | POA: Diagnosis not present

## 2021-04-01 NOTE — Progress Notes (Signed)
Virtual visit completed through WebEx or similar program Patient location: home  Provider location: [facility], office  Participants: Patient and me (unless stated otherwise below)  Pandemic considerations d/w pt.   Limitations and rationale for visit method d/w patient.  Patient agreed to proceed.   CC: DXA discussion.    HPI:  D/w pt about DXA results and osteoporosis path/phys in general, including vit D and calcium.  Reasonable to consider treatment with bisphosphonate, ie fosamax.  D/w pt about risk benefit, especially GI sx, jaw and long bone pathology.   She'll consider tx in the meantime.    Meds and allergies reviewed.   ROS: Per HPI unless specifically indicated in ROS section   NAD Speech wnl  A/P: Osteoporosis.  Discussed pathophysiology. Needs vit D level checked first.  Needs labs at [facility] next month.  We will work to get that set up. D/w pt about fosamax with routine cautions d/w pt. she can consider treatment options in the meantime.  We will get her vitamin D level and go from there.  She agrees with plan.  15 minutes were devoted to patient care in this encounter (this includes time spent reviewing the patient's file/history, interviewing and examining the patient, counseling/reviewing plan with patient).

## 2021-04-02 NOTE — Assessment & Plan Note (Signed)
Osteoporosis.  Discussed pathophysiology. Needs vit D level checked first.  Needs labs at [facility] next month.  We will work to get that set up. D/w pt about fosamax with routine cautions d/w pt. she can consider treatment options in the meantime.  We will get her vitamin D level and go from there.  She agrees with plan.

## 2021-04-16 ENCOUNTER — Other Ambulatory Visit: Payer: Self-pay

## 2021-04-16 ENCOUNTER — Ambulatory Visit
Admission: EM | Admit: 2021-04-16 | Discharge: 2021-04-16 | Disposition: A | Discharge status: Home / Self Care | Payer: Medicare Other | Attending: Internal Medicine | Admitting: Internal Medicine

## 2021-04-16 DIAGNOSIS — R350 Frequency of micturition: Secondary | ICD-10-CM | POA: Diagnosis present

## 2021-04-16 DIAGNOSIS — N3001 Acute cystitis with hematuria: Secondary | ICD-10-CM | POA: Diagnosis not present

## 2021-04-16 DIAGNOSIS — R300 Dysuria: Secondary | ICD-10-CM | POA: Insufficient documentation

## 2021-04-16 LAB — POCT URINALYSIS DIP (MANUAL ENTRY)
Bilirubin, UA: NEGATIVE
Glucose, UA: NEGATIVE mg/dL
Ketones, POC UA: NEGATIVE mg/dL
Nitrite, UA: POSITIVE — AB
Protein Ur, POC: 100 mg/dL — AB
Spec Grav, UA: 1.02 (ref 1.010–1.025)
Urobilinogen, UA: 0.2 E.U./dL
pH, UA: 7 (ref 5.0–8.0)

## 2021-04-16 MED ORDER — CEPHALEXIN 500 MG PO CAPS: 500.0000 mg | ORAL_CAPSULE | Freq: Four times a day (QID) | ORAL | 0 refills | Status: AC

## 2021-04-16 NOTE — ED Triage Notes (Signed)
2 day h/o dysuria and urinary frequency. Has tried Azo and cranberry juice w/o significant relief. No hematuria and urinary urgency.

## 2021-04-16 NOTE — ED Provider Notes (Signed)
[name]    CSN: [id number] Arrival date & time: 04/16/21  0858      History   Chief Complaint Chief Complaint  Patient presents with   Dysuria    HPI Tanya Espinoza is a 81 y.o. female.   Patient presents with a 2-day history of urinary burning and urinary frequency.  Patient denies any fever, vaginal discharge, pelvic pain, abdominal pain, fever, back pain, hematuria.  Has taken Azo and cranberry juice with no improvement in symptoms.   Dysuria  Past Medical History:  Diagnosis Date   ASYMPTOMATIC POSTMENOPAUSAL STATUS 08/23/2007   BRADYCARDIA 08/23/2007   DEPRESSION 11/30/2006   HYPERLIPIDEMIA 11/30/2006   HYPERTENSION 11/30/2006   PSORIASIS 11/30/2006   UTI'S, HX OF 11/30/2006    Patient Active Problem List   Diagnosis Date Noted   Right epiretinal membrane 02/01/2020   Pseudophakia 02/01/2020   History of vitrectomy 02/01/2020   Left epiretinal membrane 02/01/2020   Healthcare maintenance 12/25/2016   Advance care planning 12/25/2016   Skin lesion 12/25/2016   Medicare annual wellness visit, subsequent 10/24/2012   EDEMA 10/02/2009   Dyslipidemia 11/30/2006   Depression, 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   VITRECTOMY Left 10/16/2018   25 Gauge membrane peel    OB History   No obstetric history on file.      Home Medications    Prior to Admission medications   Medication Sig Start Date End Date Taking? Authorizing Provider  cephALEXin (KEFLEX) 500 MG capsule Take 1 capsule (500 mg total) by mouth 4 (four) times daily for 5 days. 04/16/21 04/21/21 Yes [name], FNP  aspirin EC 81 MG tablet Take 81 mg by mouth daily.    [provider]  Calcium Carb-Cholecalciferol (CALCIUM + D3 PO) Take 1 tablet by mouth daily.    [provider]  escitalopram (LEXAPRO) 10 MG tablet TAKE 1 TABLET DAILY  02/10/21   [name], MD  furosemide (LASIX) 20 MG tablet Take 1 tablet (20 mg total) by mouth daily. 01/17/21   [name], MD  GLUCOSAMINE HCL PO Take 2 tablets by mouth daily.    [provider]  Magnesium 400 MG TABS Take by mouth.    [provider]  Multiple Vitamins-Minerals (CENTRUM SILVER PO) Take 1 tablet by mouth daily.    [provider]  Omega-3 Fatty Acids (FISH OIL PO) Take 1 tablet by mouth daily.    [provider]  rosuvastatin (CRESTOR) 20 MG tablet Take 1 tablet (20 mg total) by mouth daily. 01/17/21   [name], MD  TURMERIC PO Take 1 tablet by mouth daily.    [provider]    Family History Family History  Problem Relation Age of Onset   Cancer Sister        Breast Cancer   Stroke Mother    Breast cancer Sister    Colon cancer Neg Hx     Social History Social History   Tobacco Use   Smoking status: Never   Smokeless tobacco: Never  Vaping Use   Vaping Use: Never used  Substance Use Topics   Alcohol use: No   Drug use: No     Allergies   Patient has no known allergies.   Review of Systems Review of Systems Per HPI  Physical Exam Triage  Vital Signs ED Triage Vitals  Enc Vitals Group     BP 04/16/21 0924 (!) 152/84     Pulse Rate 04/16/21 0924 61     Resp 04/16/21 0924 18     Temp 04/16/21 0924 98.4 F (36.9 C)     Temp Source 04/16/21 0924 Oral     SpO2 04/16/21 0924 96 %     Weight --      Height --      Head Circumference --      Peak Flow --      Pain Score 04/16/21 0926 0     Pain Loc --      Pain Edu? --      Excl. in [place]? --    No data found.  Updated Vital Signs BP (!) 152/84 (BP Location: Left Arm)    Pulse 61    Temp 98.4 F (36.9 C) (Oral)    Resp 18    SpO2 96%   Visual Acuity Right Eye Distance:   Left Eye Distance:   Bilateral Distance:    Right Eye Near:   Left Eye Near:    Bilateral Near:     Physical Exam Constitutional:      General: She  is not in acute distress.    Appearance: Normal appearance. She is not toxic-appearing or diaphoretic.  HENT:     Head: Normocephalic and atraumatic.  Eyes:     Extraocular Movements: Extraocular movements intact.     Conjunctiva/sclera: Conjunctivae normal.  Cardiovascular:     Rate and Rhythm: Normal rate and regular rhythm.     Pulses: Normal pulses.     Heart sounds: Normal heart sounds.  Pulmonary:     Effort: Pulmonary effort is normal.     Breath sounds: Normal breath sounds.  Abdominal:     General: Bowel sounds are normal. There is no distension.     Palpations: Abdomen is soft.     Tenderness: There is no abdominal tenderness.  Neurological:     General: No focal deficit present.     Mental Status: She is alert and oriented to person, place, and time. Mental status is at baseline.  Psychiatric:        Mood and Affect: Mood normal.        Behavior: Behavior normal.        Thought Content: Thought content normal.        Judgment: Judgment normal.     UC Treatments / Results  Labs (all labs ordered are listed, but only abnormal results are displayed) Labs Reviewed  POCT URINALYSIS DIP (MANUAL ENTRY) - Abnormal; Notable for the following components:      Result Value   Clarity, UA cloudy (*)    Blood, UA large (*)    Protein Ur, POC =100 (*)    Nitrite, UA Positive (*)    Leukocytes, UA Small (1+) (*)    All other components within normal limits  URINE CULTURE    EKG   Radiology No results found.  Procedures Procedures (including critical care time)  Medications Ordered in UC Medications - No data to display  Initial Impression / Assessment and Plan / UC Course  I have reviewed the triage vital signs and the nursing notes.  Pertinent labs & imaging results that were available during my care of the patient were reviewed by me and considered in my medical decision making (see chart for details).     Urinalysis indicating urinary tract  infection.   Will treat with cephalexin antibiotic given that renal function is normal.  Urine culture is pending.  Patient to increase clear oral fluid intake.  Discussed strict return precautions.  Patient verbalized understanding and was agreeable with plan. Final Clinical Impressions(s) / UC Diagnoses   Final diagnoses:  Acute cystitis with hematuria  Dysuria  Urinary frequency     Discharge Instructions      It appears that you have a urinary tract infection.  This is being treated with an antibiotic.  Urine culture is pending.  We will call if there are any abnormalities on the urine culture.    ED Prescriptions     Medication Sig Dispense Auth. Provider   cephALEXin (KEFLEX) 500 MG capsule Take 1 capsule (500 mg total) by mouth 4 (four) times daily for 5 days. 20 capsule [name], [place]      PDMP not reviewed this encounter.   [name], [place] 04/16/21 1008

## 2021-04-16 NOTE — Discharge Instructions (Addendum)
It appears that you have a urinary tract infection.  This is being treated with an antibiotic.  Urine culture is pending.  We will call if there are any abnormalities on the urine culture.

## 2021-04-18 LAB — URINE CULTURE: Culture: >=100000 — AB

## 2021-05-02 ENCOUNTER — Other Ambulatory Visit: Payer: Medicare Other

## 2021-05-02 ENCOUNTER — Other Ambulatory Visit (INDEPENDENT_AMBULATORY_CARE_PROVIDER_SITE_OTHER): Payer: Medicare Other

## 2021-05-02 ENCOUNTER — Other Ambulatory Visit: Payer: Self-pay

## 2021-05-02 DIAGNOSIS — M810 Age-related osteoporosis without current pathological fracture: Secondary | ICD-10-CM | POA: Diagnosis not present

## 2021-05-02 LAB — VITAMIN D 25 HYDROXY (VIT D DEFICIENCY, FRACTURES): VITD: 60.22 ng/mL (ref 30.00–100.00)

## 2021-05-12 ENCOUNTER — Telehealth: Payer: Self-pay | Admitting: Family Medicine

## 2021-05-12 ENCOUNTER — Other Ambulatory Visit: Payer: Self-pay | Admitting: Family Medicine

## 2021-05-12 MED ORDER — ALENDRONATE SODIUM 70 MG PO TABS: 70.0000 mg | ORAL_TABLET | ORAL | 3 refills | Status: DC

## 2021-05-12 NOTE — Telephone Encounter (Signed)
Looks like rx was sent in this am and patient has been made aware.

## 2021-05-12 NOTE — Telephone Encounter (Signed)
Mrs. Youtz called in and wanted to know about the prescription Fosamax .  Pharmacy- [facility] [address]

## 2021-12-15 ENCOUNTER — Telehealth: Payer: Self-pay | Admitting: *Deleted

## 2021-12-15 NOTE — Telephone Encounter (Signed)
PLEASE NOTE: All timestamps contained within this report are represented as [place] Standard Time. CONFIDENTIALTY NOTICE: This fax transmission is intended only for the addressee. It contains information that is legally privileged, confidential or otherwise protected from use or disclosure. If you are not the intended recipient, you are strictly prohibited from reviewing, disclosing, copying using or disseminating any of this information or taking any action in reliance on or regarding this information. If you have received this fax in error, please notify us immediately by telephone so that we can arrange for its return to [place]. Phone: [phone], Toll-Free: [phone], Fax: [phone] Page: 1 of 1 Call Id: [id number] [facility] - Client Nonclinical Telephone Record  [facility] Client [place] [facility] - Client Client Site [place] Provider Renford Dills - MD Contact Type Call Who Is Calling Patient / Member / Family / Caregiver Caller Name Tanya Espinoza Caller Phone Number [phone] Patient Name Tanya Espinoza Patient DOB [birth year] Call Type Message Only Information Provided Reason for Call Request for General Office Information Initial Comment Caller wants to know when her next appt. is Disp. Time Disposition Final User 12/14/2021 4:37:34 PM General Information Provided Yes Norton Blizzard Call Closed By: Norton Blizzard Transaction Date/Time: 12/14/2021 4:35:20 PM (ET)

## 2021-12-19 NOTE — Telephone Encounter (Signed)
Patient called back in and made aware of her next appointment.

## 2021-12-22 ENCOUNTER — Other Ambulatory Visit: Payer: Self-pay | Admitting: Family Medicine

## 2021-12-23 ENCOUNTER — Other Ambulatory Visit: Payer: Self-pay | Admitting: Family Medicine

## 2022-01-11 ENCOUNTER — Other Ambulatory Visit: Payer: Self-pay | Admitting: Family Medicine

## 2022-01-11 DIAGNOSIS — M810 Age-related osteoporosis without current pathological fracture: Secondary | ICD-10-CM

## 2022-01-11 DIAGNOSIS — E785 Hyperlipidemia, unspecified: Secondary | ICD-10-CM

## 2022-01-12 ENCOUNTER — Other Ambulatory Visit (INDEPENDENT_AMBULATORY_CARE_PROVIDER_SITE_OTHER): Payer: Medicare Other

## 2022-01-12 DIAGNOSIS — M810 Age-related osteoporosis without current pathological fracture: Secondary | ICD-10-CM

## 2022-01-12 DIAGNOSIS — E785 Hyperlipidemia, unspecified: Secondary | ICD-10-CM

## 2022-01-12 LAB — LIPID PANEL
Cholesterol: 172 mg/dL (ref 0–200)
HDL: 84 mg/dL (ref >39.00)
LDL Cholesterol: 78 mg/dL (ref 0–99)
NonHDL: 87.91
Total CHOL/HDL Ratio: 2
Triglycerides: 52 mg/dL (ref 0.0–149.0)
VLDL: 10.4 mg/dL (ref 0.0–40.0)

## 2022-01-12 LAB — COMPREHENSIVE METABOLIC PANEL
ALT: 17 U/L (ref 0–35)
AST: 23 U/L (ref 0–37)
Albumin: 4.3 g/dL (ref 3.5–5.2)
Alkaline Phosphatase: 40 U/L (ref 39–117)
BUN: 11 mg/dL (ref 6–23)
CO2: 25 mEq/L (ref 19–32)
Calcium: 9.3 mg/dL (ref 8.4–10.5)
Chloride: 108 mEq/L (ref 96–112)
Creatinine, Ser: 0.68 mg/dL (ref 0.40–1.20)
GFR: 82.91 mL/min (ref >60.00)
Glucose, Bld: 106 mg/dL — ABNORMAL HIGH (ref 70–99)
Potassium: 3.8 mEq/L (ref 3.5–5.1)
Sodium: 142 mEq/L (ref 135–145)
Total Bilirubin: 0.5 mg/dL (ref 0.2–1.2)
Total Protein: 6.5 g/dL (ref 6.0–8.3)

## 2022-01-12 LAB — CBC WITH DIFFERENTIAL/PLATELET
Basophils Absolute: 0 10*3/uL (ref 0.0–0.1)
Basophils Relative: 0.6 % (ref 0.0–3.0)
Eosinophils Absolute: 0 10*3/uL (ref 0.0–0.7)
Eosinophils Relative: 0.6 % (ref 0.0–5.0)
HCT: 37.2 % (ref 36.0–46.0)
Hemoglobin: 12.4 g/dL (ref 12.0–15.0)
Lymphocytes Relative: 28.3 % (ref 12.0–46.0)
Lymphs Abs: 1.7 10*3/uL (ref 0.7–4.0)
MCHC: 33.3 g/dL (ref 30.0–36.0)
MCV: 88.9 fl (ref 78.0–100.0)
Monocytes Absolute: 0.3 10*3/uL (ref 0.1–1.0)
Monocytes Relative: 5.5 % (ref 3.0–12.0)
Neutro Abs: 3.9 10*3/uL (ref 1.4–7.7)
Neutrophils Relative %: 65 % (ref 43.0–77.0)
Platelets: 180 10*3/uL (ref 150.0–400.0)
RBC: 4.19 Mil/uL (ref 3.87–5.11)
RDW: 14.6 % (ref 11.5–15.5)
WBC: 6 10*3/uL (ref 4.0–10.5)

## 2022-01-12 LAB — VITAMIN D 25 HYDROXY (VIT D DEFICIENCY, FRACTURES): VITD: 51.54 ng/mL (ref 30.00–100.00)

## 2022-01-12 LAB — TSH: TSH: 0.85 u[IU]/mL (ref 0.35–5.50)

## 2022-01-20 ENCOUNTER — Encounter: Payer: Self-pay | Admitting: Family Medicine

## 2022-01-20 ENCOUNTER — Ambulatory Visit: Payer: Medicare Other

## 2022-01-20 ENCOUNTER — Ambulatory Visit (INDEPENDENT_AMBULATORY_CARE_PROVIDER_SITE_OTHER): Payer: Medicare Other | Admitting: Family Medicine

## 2022-01-20 ENCOUNTER — Other Ambulatory Visit: Payer: Self-pay | Admitting: Family Medicine

## 2022-01-20 VITALS — BP 110/74 | HR 70 | Temp 98.0°F | Ht 60.0 in | Wt 129.0 lb

## 2022-01-20 DIAGNOSIS — Z1231 Encounter for screening mammogram for malignant neoplasm of breast: Secondary | ICD-10-CM

## 2022-01-20 DIAGNOSIS — Z23 Encounter for immunization: Secondary | ICD-10-CM

## 2022-01-20 DIAGNOSIS — Z7189 Other specified counseling: Secondary | ICD-10-CM

## 2022-01-20 DIAGNOSIS — E785 Hyperlipidemia, unspecified: Secondary | ICD-10-CM | POA: Diagnosis not present

## 2022-01-20 DIAGNOSIS — M810 Age-related osteoporosis without current pathological fracture: Secondary | ICD-10-CM | POA: Diagnosis not present

## 2022-01-20 DIAGNOSIS — F339 Major depressive disorder, recurrent, unspecified: Secondary | ICD-10-CM | POA: Diagnosis not present

## 2022-01-20 DIAGNOSIS — Z0000 Encounter for general adult medical examination without abnormal findings: Secondary | ICD-10-CM | POA: Diagnosis not present

## 2022-01-20 MED ORDER — FUROSEMIDE 20 MG PO TABS: 20.0000 mg | ORAL_TABLET | Freq: Every day | ORAL | 3 refills | Status: DC

## 2022-01-20 MED ORDER — ROSUVASTATIN CALCIUM 20 MG PO TABS: 20.0000 mg | ORAL_TABLET | Freq: Every day | ORAL | 3 refills | Status: DC

## 2022-01-20 MED ORDER — ALENDRONATE SODIUM 70 MG PO TABS: 70.0000 mg | ORAL_TABLET | ORAL | 3 refills | Status: DC

## 2022-01-20 MED ORDER — ESCITALOPRAM OXALATE 10 MG PO TABS: 10.0000 mg | ORAL_TABLET | Freq: Every day | ORAL | 3 refills | Status: DC

## 2022-01-20 NOTE — Patient Instructions (Signed)
Thank you for your effort.  Take care.  Glad to see you.  Update me as needed.  

## 2022-01-20 NOTE — Progress Notes (Unsigned)
I have personally reviewed the Medicare Annual Wellness questionnaire and have noted 1. The patient's medical and social history 2. Their use of alcohol, tobacco or illicit drugs 3. Their current medications and supplements 4. The patient's functional ability including ADL's, fall risks, home safety risks and hearing or visual             impairment. 5. Diet and physical activities 6. Evidence for depression or mood disorders  The patients weight, height, BMI have been recorded in the chart and visual acuity is per eye clinic.  I have made referrals, counseling and provided education to the patient based review of the above and I have provided the pt with a written personalized care plan for preventive services.  Provider list updated- see scanned forms.  Routine anticipatory guidance given to patient.  See health maintenance. The possibility exists that previously documented standard health maintenance information may have been brought forward from a previous encounter into this note.  If needed, that same information has been updated to reflect the current situation based on today's encounter.    Flu Shingles PNA Tetanus Colon  Breast cancer screening Prostate cancer screening Advance directive Cognitive function addressed- see scanned forms- and if abnormal then additional documentation follows.   In addition to [facility], follow up visit for the below conditions:  Osteoporosis.  No ADE on med.  Swallowing well, d/w pt about med use, routine cautions d/w pt. Labs d/w pt.    Elevated Cholesterol: Using medications without problems: yes Muscle aches:  no  Diet compliance: yes Exercise: yes  Mood d/w pt.  Still on SSRI at baseline.  Husband was profoundly ill and required longstanding hospitalization.  Discussed her situation related to that.  No SI/HI.  Her sister died this year.  Her great nephew required hospitalization right after birth.    PMH and SH reviewed  Meds,  vitals, and allergies reviewed.   ROS: Per HPI.  Unless specifically indicated otherwise in HPI, the patient denies:  General: fever. Eyes: acute vision changes ENT: sore throat Cardiovascular: chest pain Respiratory: SOB GI: vomiting GU: dysuria Musculoskeletal: acute back pain Derm: acute rash Neuro: acute motor dysfunction Psych: worsening mood Endocrine: polydipsia Heme: bleeding Allergy: hayfever  GEN: nad, alert and oriented HEENT: mucous membranes moist NECK: supple w/o LA CV: rrr. PULM: ctab, no inc wob ABD: soft, +bs EXT: no edema SKIN: no acute rash

## 2022-01-21 NOTE — Assessment & Plan Note (Signed)
Flu 2023 Shingles discussed with patient.  Zostavax previously done. PNA previously done Tetanus 2015 COVID-vaccine previously done Colon cancer screening not due given her age. Breast cancer screening 2023 Bone density test 2023.   Advance directive-husband designated if patient were incapacitated. Cognitive function addressed- see scanned forms- and if abnormal then additional documentation follows.

## 2022-01-21 NOTE — Assessment & Plan Note (Signed)
No ADE on Fosamax.  Swallowing well, d/w pt about med use, routine cautions d/w pt. Labs d/w pt.   continue Fosamax.

## 2022-01-21 NOTE — Assessment & Plan Note (Signed)
Continue Lexapro.  I thanked her for her effort caring for her husband.  Her husband has been ill, her great nephew required hospitalization, and she has had a lot going on.  I think it makes sense to continue Lexapro as is.  She will update me as needed.

## 2022-01-21 NOTE — Assessment & Plan Note (Signed)
Advance directive- husband designated if patient were incapacitated.  

## 2022-01-21 NOTE — Assessment & Plan Note (Signed)
Continue Crestor.  Continue work on diet and exercise.  Labs discussed with patient. 

## 2022-02-03 ENCOUNTER — Encounter (INDEPENDENT_AMBULATORY_CARE_PROVIDER_SITE_OTHER): Payer: Medicare Other | Admitting: Ophthalmology

## 2022-03-23 ENCOUNTER — Ambulatory Visit
Admission: RE | Admit: 2022-03-23 | Discharge: 2022-03-23 | Disposition: A | Discharge status: Home / Self Care | Payer: Medicare Other | Source: Ambulatory Visit | Attending: Family Medicine | Admitting: Family Medicine

## 2022-03-23 DIAGNOSIS — Z1231 Encounter for screening mammogram for malignant neoplasm of breast: Secondary | ICD-10-CM

## 2022-12-15 ENCOUNTER — Encounter: Payer: Self-pay | Admitting: Emergency Medicine

## 2022-12-15 ENCOUNTER — Other Ambulatory Visit: Payer: Self-pay

## 2022-12-15 ENCOUNTER — Ambulatory Visit
Admission: EM | Admit: 2022-12-15 | Discharge: 2022-12-15 | Disposition: A | Discharge status: Home / Self Care | Payer: Medicare Other | Attending: Internal Medicine | Admitting: Internal Medicine

## 2022-12-15 DIAGNOSIS — T7840XA Allergy, unspecified, initial encounter: Secondary | ICD-10-CM | POA: Diagnosis not present

## 2022-12-15 DIAGNOSIS — W57XXXA Bitten or stung by nonvenomous insect and other nonvenomous arthropods, initial encounter: Secondary | ICD-10-CM | POA: Diagnosis not present

## 2022-12-15 DIAGNOSIS — S90869A Insect bite (nonvenomous), unspecified foot, initial encounter: Secondary | ICD-10-CM

## 2022-12-15 MED ORDER — TRIAMCINOLONE ACETONIDE 0.1 % EX CREA: 1.0000 | TOPICAL_CREAM | Freq: Two times a day (BID) | CUTANEOUS | 0 refills | Status: AC

## 2022-12-15 NOTE — ED Provider Notes (Signed)
[name]    CSN: [id number] Arrival date & time: 12/15/22  1004      History   Chief Complaint Chief Complaint  Patient presents with   Insect Bite    HPI Tanya Espinoza is a 82 y.o. female.   Patient presents today due to ant bites to bilateral feet and one to right hand that occurred about 3 days ago.  Patient reports that she was walking through a graveyard when ants started biting her feet.  She tried to get them off with her hand which caused the ant bite to her hand.  She has applied calamine lotion and ice with minimal improvement.  Patient is not reporting any feelings of throat closing or shortness of breath. States that bites are very itchy.      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   VITRECTOMY Left 10/16/2018   25 Gauge membrane peel    OB History   No obstetric history on file.      Home Medications    Prior to Admission medications   Medication Sig Start Date End Date Taking? Authorizing Provider  triamcinolone cream (KENALOG) 0.1 % Apply 1 Application topically 2 (two) times daily. 12/15/22  Yes Merari Pion, Acie Fredrickson, FNP  alendronate (FOSAMAX) 70 MG tablet Take 1 tablet (70 mg  total) by mouth every 7 (seven) days. Take with a full glass of water on an empty stomach. 01/20/22   Joaquim Nam, MD  aspirin EC 81 MG tablet Take 81 mg by mouth daily.    [provider]  Calcium Carb-Cholecalciferol (CALCIUM + D3 PO) Take 1 tablet by mouth daily.    [provider]  escitalopram (LEXAPRO) 10 MG tablet Take 1 tablet (10 mg total) by mouth daily. 01/20/22   Joaquim Nam, MD  furosemide (LASIX) 20 MG tablet Take 1 tablet (20 mg total) by mouth daily. 01/20/22   Joaquim Nam, MD  GLUCOSAMINE HCL PO Take 2 tablets by mouth daily.    [provider]  Multiple Vitamins-Minerals (CENTRUM SILVER PO) Take 1 tablet by mouth daily.    [provider]  Omega-3 Fatty Acids (FISH OIL PO) Take 1 tablet by mouth daily.    [provider]  rosuvastatin (CRESTOR) 20 MG tablet Take 1 tablet (20 mg total) by mouth daily. 01/20/22   Joaquim Nam, MD  TURMERIC PO Take 1 tablet by mouth daily.    [provider]    Family History Family History  Problem Relation Age of Onset   Cancer Sister        Breast Cancer   Stroke Mother    Breast cancer Sister    Colon cancer Neg Hx  Social History Social History   Tobacco Use   Smoking status: Never   Smokeless tobacco: Never  Vaping Use   Vaping status: Never Used  Substance Use Topics   Alcohol use: No   Drug use: No     Allergies   Patient has no known allergies.   Review of Systems Review of Systems Per HPI  Physical Exam Triage Vital Signs ED Triage Vitals [12/15/22 1108]  Encounter Vitals Group     BP (!) 129/58     Systolic BP Percentile      Diastolic BP Percentile      Pulse Rate 66     Resp 18     Temp 98.3 F (36.8 C)     Temp Source Oral     SpO2 98 %     Weight      Height      Head Circumference      Peak Flow      Pain Score 0     Pain Loc      Pain Education      Exclude from Growth Chart    No data found.  Updated Vital  Signs BP (!) 129/58 (BP Location: Left Arm)   Pulse 66   Temp 98.3 F (36.8 C) (Oral)   Resp 18   SpO2 98%   Visual Acuity Right Eye Distance:   Left Eye Distance:   Bilateral Distance:    Right Eye Near:   Left Eye Near:    Bilateral Near:     Physical Exam Constitutional:      General: She is not in acute distress.    Appearance: Normal appearance. She is not toxic-appearing or diaphoretic.  HENT:     Head: Normocephalic and atraumatic.  Eyes:     Extraocular Movements: Extraocular movements intact.     Conjunctiva/sclera: Conjunctivae normal.  Pulmonary:     Effort: Pulmonary effort is normal.  Skin:    Comments: Multiple, singular pinpoint vesicular lesions scattered throughout the bilateral dorsal surface of feet and toes at the distal portion of the foot.  1 singular lesion present to the MCP joint of the second digit of the right hand.  Neurological:     General: No focal deficit present.     Mental Status: She is alert and oriented to person, place, and time. Mental status is at baseline.  Psychiatric:        Mood and Affect: Mood normal.        Behavior: Behavior normal.        Thought Content: Thought content normal.        Judgment: Judgment normal.      UC Treatments / Results  Labs (all labs ordered are listed, but only abnormal results are displayed) Labs Reviewed - No data to display  EKG   Radiology No results found.  Procedures Procedures (including critical care time)  Medications Ordered in UC Medications - No data to display  Initial Impression / Assessment and Plan / UC Course  I have reviewed the triage vital signs and the nursing notes.  Pertinent labs & imaging results that were available during my care of the patient were reviewed by me and considered in my medical decision making (see chart for details).     Patient has a localized reaction to insect bites to bilateral feet.  No obvious signs of infection or cellulitis.  Will  treat with topical triamcinolone.  Advised patient to monitor closely and follow-up  if symptoms persist or worsen.  Patient verbalized understanding and was agreeable with plan. Final Clinical Impressions(s) / UC Diagnoses   Final diagnoses:  Insect bite of foot, unspecified laterality, initial encounter  Allergic reaction, initial encounter     Discharge Instructions      I have prescribed you a cream to help with itching and inflammation to the ant bites to your feet.  Follow-up if any symptoms persist or worsen.    ED Prescriptions     Medication Sig Dispense Auth. Provider   triamcinolone cream (KENALOG) 0.1 % Apply 1 Application topically 2 (two) times daily. 30 g Gustavus Bryant, [place]      PDMP not reviewed this encounter.   Gustavus Bryant, [place] 12/15/22 1125

## 2022-12-15 NOTE — ED Triage Notes (Signed)
Pt here with itching from ant bites to feet x 3 days ago

## 2022-12-15 NOTE — Discharge Instructions (Signed)
I have prescribed you a cream to help with itching and inflammation to the ant bites to your feet.  Follow-up if any symptoms persist or worsen.

## 2022-12-29 ENCOUNTER — Other Ambulatory Visit: Payer: Self-pay | Admitting: Family Medicine

## 2023-01-14 ENCOUNTER — Other Ambulatory Visit: Payer: Self-pay | Admitting: Family Medicine

## 2023-01-14 DIAGNOSIS — M810 Age-related osteoporosis without current pathological fracture: Secondary | ICD-10-CM

## 2023-01-14 DIAGNOSIS — E785 Hyperlipidemia, unspecified: Secondary | ICD-10-CM

## 2023-01-19 ENCOUNTER — Other Ambulatory Visit (INDEPENDENT_AMBULATORY_CARE_PROVIDER_SITE_OTHER): Payer: Medicare Other

## 2023-01-19 DIAGNOSIS — M810 Age-related osteoporosis without current pathological fracture: Secondary | ICD-10-CM

## 2023-01-19 DIAGNOSIS — E785 Hyperlipidemia, unspecified: Secondary | ICD-10-CM

## 2023-01-19 LAB — LIPID PANEL
Cholesterol: 166 mg/dL (ref 0–200)
HDL: 84 mg/dL (ref >39.00)
LDL Cholesterol: 72 mg/dL (ref 0–99)
NonHDL: 81.73
Total CHOL/HDL Ratio: 2
Triglycerides: 48 mg/dL (ref 0.0–149.0)
VLDL: 9.6 mg/dL (ref 0.0–40.0)

## 2023-01-19 LAB — COMPREHENSIVE METABOLIC PANEL
ALT: 22 U/L (ref 0–35)
AST: 28 U/L (ref 0–37)
Albumin: 4.3 g/dL (ref 3.5–5.2)
Alkaline Phosphatase: 35 U/L — ABNORMAL LOW (ref 39–117)
BUN: 13 mg/dL (ref 6–23)
CO2: 27 meq/L (ref 19–32)
Calcium: 9.6 mg/dL (ref 8.4–10.5)
Chloride: 108 meq/L (ref 96–112)
Creatinine, Ser: 0.79 mg/dL (ref 0.40–1.20)
GFR: 70.71 mL/min (ref >60.00)
Glucose, Bld: 106 mg/dL — ABNORMAL HIGH (ref 70–99)
Potassium: 4.1 meq/L (ref 3.5–5.1)
Sodium: 143 meq/L (ref 135–145)
Total Bilirubin: 0.6 mg/dL (ref 0.2–1.2)
Total Protein: 6.8 g/dL (ref 6.0–8.3)

## 2023-01-19 LAB — CBC WITH DIFFERENTIAL/PLATELET
Basophils Absolute: 0 10*3/uL (ref 0.0–0.1)
Basophils Relative: 0.6 % (ref 0.0–3.0)
Eosinophils Absolute: 0.1 10*3/uL (ref 0.0–0.7)
Eosinophils Relative: 2.1 % (ref 0.0–5.0)
HCT: 39.2 % (ref 36.0–46.0)
Hemoglobin: 12.9 g/dL (ref 12.0–15.0)
Lymphocytes Relative: 37.3 % (ref 12.0–46.0)
Lymphs Abs: 1.9 10*3/uL (ref 0.7–4.0)
MCHC: 32.9 g/dL (ref 30.0–36.0)
MCV: 88.8 fL (ref 78.0–100.0)
Monocytes Absolute: 0.3 10*3/uL (ref 0.1–1.0)
Monocytes Relative: 5.3 % (ref 3.0–12.0)
Neutro Abs: 2.8 10*3/uL (ref 1.4–7.7)
Neutrophils Relative %: 54.7 % (ref 43.0–77.0)
Platelets: 191 10*3/uL (ref 150.0–400.0)
RBC: 4.41 Mil/uL (ref 3.87–5.11)
RDW: 14.9 % (ref 11.5–15.5)
WBC: 5.2 10*3/uL (ref 4.0–10.5)

## 2023-01-19 LAB — TSH: TSH: 1.04 u[IU]/mL (ref 0.35–5.50)

## 2023-01-19 LAB — VITAMIN D 25 HYDROXY (VIT D DEFICIENCY, FRACTURES): VITD: 50.37 ng/mL (ref 30.00–100.00)

## 2023-01-25 ENCOUNTER — Telehealth: Payer: Self-pay | Admitting: Family Medicine

## 2023-01-25 NOTE — Telephone Encounter (Signed)
Called patient states a few weeks ago symptoms became more noticeable. Patient has appointment soon for AWV. Advised we may not be able to address at that appointment. Would let her know if not so she can set up appointment for that.

## 2023-01-25 NOTE — Telephone Encounter (Signed)
If she can wait to the 14th, then we can talk about it then and refer if needed.  I don't think we can her her seen by ENT before then.  Thanks.

## 2023-01-25 NOTE — Telephone Encounter (Signed)
Patient called in and wanted to know if Dr. Para March could put her a referral in for an ENT. She stated that she is having issues with her ear feeling clogged and opening/closing. Thank you!

## 2023-01-26 ENCOUNTER — Encounter: Payer: Medicare Other | Admitting: Family Medicine

## 2023-01-28 ENCOUNTER — Ambulatory Visit (INDEPENDENT_AMBULATORY_CARE_PROVIDER_SITE_OTHER): Payer: Medicare Other | Admitting: Family Medicine

## 2023-01-28 ENCOUNTER — Encounter: Payer: Self-pay | Admitting: Family Medicine

## 2023-01-28 VITALS — BP 118/72 | HR 65 | Temp 98.5°F | Ht 60.0 in | Wt 139.4 lb

## 2023-01-28 DIAGNOSIS — M810 Age-related osteoporosis without current pathological fracture: Secondary | ICD-10-CM | POA: Diagnosis not present

## 2023-01-28 DIAGNOSIS — H6120 Impacted cerumen, unspecified ear: Secondary | ICD-10-CM | POA: Diagnosis not present

## 2023-01-28 DIAGNOSIS — E785 Hyperlipidemia, unspecified: Secondary | ICD-10-CM

## 2023-01-28 DIAGNOSIS — Z7189 Other specified counseling: Secondary | ICD-10-CM

## 2023-01-28 DIAGNOSIS — F339 Major depressive disorder, recurrent, unspecified: Secondary | ICD-10-CM | POA: Diagnosis not present

## 2023-01-28 DIAGNOSIS — Z0000 Encounter for general adult medical examination without abnormal findings: Secondary | ICD-10-CM

## 2023-01-28 MED ORDER — FUROSEMIDE 20 MG PO TABS: 20.0000 mg | ORAL_TABLET | Freq: Every day | ORAL | 3 refills | Status: DC

## 2023-01-28 MED ORDER — ROSUVASTATIN CALCIUM 20 MG PO TABS: 20.0000 mg | ORAL_TABLET | Freq: Every day | ORAL | 3 refills | Status: DC

## 2023-01-28 MED ORDER — ESCITALOPRAM OXALATE 10 MG PO TABS: 10.0000 mg | ORAL_TABLET | Freq: Every day | ORAL | 3 refills | Status: DC

## 2023-01-28 MED ORDER — VITAMIN D3 50 MCG (2000 UT) PO CAPS: 2000.0000 [IU] | ORAL_CAPSULE | Freq: Every day | ORAL | Status: AC

## 2023-01-28 NOTE — Patient Instructions (Addendum)
Update me as needed.  Take care.  Glad to see you. Try debrox in your right ear canal for 5 minutes then rinse with warm water.

## 2023-01-28 NOTE — Progress Notes (Signed)
R ear "opens and closes".  Variable hearing.  Going on for about 2-3 weeks.  No ear pain.    Osteoporosis.  Fosamax started 04/2021.  D/w pt about taking 2000 units vit D.  No ADE on Fosamax.  Bisphosphonate cautions d/w pt.    Elevated Cholesterol: Using medications without problems: yes Muscle aches: no Diet compliance: yes Exercise: yes Labs d/w pt.    No CP.  Not SOB.    Mood d/w pt.  Still on lexparo.  She wanted to continue as is.  No SI/HI.  Mood is good.  Flu shot 2024 Tdap 2015 zosta 2013 PNA up to date.   Covid 2024 Mammogram 2024 DXA 2023 Colonoscopy 2015 Pap not due.   Diet and execrise d/w pt.  Encouraged both.   Advance directive d/w pt.  Husband designated if patient were incapacitated.    Meds, vitals, and allergies reviewed.   ROS: Per HPI unless specifically indicated in ROS section   GEN: nad, alert and oriented HEENT: mucous membranes moist, bilateral cerumen impaction noted.  Tight ear canals noted bilaterally at baseline.  Left wax was removed with irrigation without complication.  Right cerumen impaction remains with irrigation.  She consented for treatment.  No complications but right impaction was not removed. NECK: supple w/o LA CV: rrr PULM: ctab, no inc wob ABD: soft, +bs EXT: no edema SKIN: no acute rash

## 2023-01-31 DIAGNOSIS — H6120 Impacted cerumen, unspecified ear: Secondary | ICD-10-CM | POA: Insufficient documentation

## 2023-01-31 NOTE — Assessment & Plan Note (Signed)
Flu shot 2024 Tdap 2015 zosta 2013 PNA up to date.   Covid 2024 Mammogram 2024 DXA 2023 Colonoscopy 2015 Pap not due.   Diet and execrise d/w pt.  Encouraged both.   Advance directive d/w pt.  Husband designated if patient were incapacitated.

## 2023-01-31 NOTE — Assessment & Plan Note (Signed)
Advance directive- d/w pt. Husband designated if patient were incapacitated.  

## 2023-01-31 NOTE — Assessment & Plan Note (Signed)
Discussed home instructions.  Try debrox in right ear canal for 5 minutes then rinse with warm water.

## 2023-01-31 NOTE — Assessment & Plan Note (Signed)
Still on lexparo.  She wanted to continue as is.  No SI/HI.   Continue Lexapro.  Update me as needed.

## 2023-01-31 NOTE — Assessment & Plan Note (Signed)
Continue Crestor.  Continue work on diet and exercise.  Labs discussed with patient. 

## 2023-01-31 NOTE — Assessment & Plan Note (Signed)
Fosamax started 04/2021.  D/w pt about taking 2000 units vit D.  No ADE on Fosamax.  Bisphosphonate cautions d/w pt.

## 2023-02-04 ENCOUNTER — Other Ambulatory Visit: Payer: Self-pay | Admitting: Family Medicine

## 2023-02-04 DIAGNOSIS — Z1231 Encounter for screening mammogram for malignant neoplasm of breast: Secondary | ICD-10-CM

## 2023-02-24 ENCOUNTER — Ambulatory Visit: Payer: Medicare Other

## 2023-02-24 VITALS — Ht 60.0 in | Wt 139.0 lb

## 2023-02-24 DIAGNOSIS — Z0000 Encounter for general adult medical examination without abnormal findings: Secondary | ICD-10-CM | POA: Diagnosis not present

## 2023-02-24 NOTE — Progress Notes (Signed)
Subjective:   Tanya Espinoza is a 82 y.o. female who presents for Medicare Annual (Subsequent) preventive examination.  Visit Complete: Virtual I connected with  Heide Guile on 02/24/23 by a audio enabled telemedicine application and verified that I am speaking with the correct person using two identifiers.  Patient Location: Home  Provider Location: Home Office  I discussed the limitations of evaluation and management by telemedicine. The patient expressed understanding and agreed to proceed.  Vital Signs: Because this visit was a virtual/telehealth visit, some criteria may be missing or patient reported. Any vitals not documented were not able to be obtained and vitals that have been documented are patient reported.  Patient Medicare AWV questionnaire was completed by the patient on (not done); I have confirmed that all information answered by patient is correct and no changes since this date. Cardiac Risk Factors include: advanced age (>76men, >39 women);dyslipidemia    Objective:    Today's Vitals   02/24/23 1305  Weight: 139 lb (63 kg)  Height: 5' (1.524 m)   Body mass index is 27.15 kg/m.     02/24/2023    1:16 PM 12/22/2019   10:32 AM 12/14/2017   10:26 AM 12/08/2016   10:23 AM 12/03/2015   11:14 AM 07/26/2015    8:34 AM 11/27/2014    8:39 AM  Advanced Directives  Does Patient Have a Medical Advance Directive? No No No Yes No No No  Type of Advance Directive    Living will     Would patient like information on creating a medical advance directive?  No - Patient declined No - Patient declined        Current Medications (verified) Outpatient Encounter Medications as of 02/24/2023  Medication Sig   alendronate (FOSAMAX) 70 MG tablet TAKE 1 TABLET EVERY 7 DAYS,WITH A FULL GLASS OF WATER ON AN EMPTY STOMACH   aspirin EC 81 MG tablet Take 81 mg by mouth daily.   Cholecalciferol (VITAMIN D3) 50 MCG (2000 UT) capsule Take 1 capsule (2,000 Units total) by mouth  daily.   escitalopram (LEXAPRO) 10 MG tablet Take 1 tablet (10 mg total) by mouth daily.   furosemide (LASIX) 20 MG tablet Take 1 tablet (20 mg total) by mouth daily.   GLUCOSAMINE HCL PO Take 2 tablets by mouth daily.   Multiple Vitamins-Minerals (CENTRUM SILVER PO) Take 1 tablet by mouth daily.   Omega-3 Fatty Acids (FISH OIL PO) Take 1 tablet by mouth daily.   rosuvastatin (CRESTOR) 20 MG tablet Take 1 tablet (20 mg total) by mouth daily.   triamcinolone cream (KENALOG) 0.1 % Apply 1 Application topically 2 (two) times daily.   TURMERIC PO Take 1 tablet by mouth daily.   No facility-administered encounter medications on file as of 02/24/2023.    Allergies (verified) Patient has no known allergies.   History: Past Medical History:  Diagnosis Date   ASYMPTOMATIC POSTMENOPAUSAL STATUS 08/23/2007   BRADYCARDIA 08/23/2007   DEPRESSION 11/30/2006   HYPERLIPIDEMIA 11/30/2006   HYPERTENSION 11/30/2006   PSORIASIS 11/30/2006   UTI'S, HX OF 11/30/2006   Past Surgical History:  Procedure Laterality Date   EYE SURGERY     cataracts bil.   VITRECTOMY Left 10/16/2018   25 Gauge membrane peel   Family History  Problem Relation Age of Onset   Cancer Sister        Breast Cancer   Stroke Mother    Breast cancer Sister    Colon cancer Neg Hx  Social History   Socioeconomic History   Marital status: Married    Spouse name: Not on file   Number of children: Not on file   Years of education: Not on file   Highest education level: Not on file  Occupational History   Occupation: Insurance  Tobacco Use   Smoking status: Never   Smokeless tobacco: Never  Vaping Use   Vaping status: Never Used  Substance and Sexual Activity   Alcohol use: No   Drug use: No   Sexual activity: Yes    Birth control/protection: None  Other Topics Concern   Not on file  Social History Narrative   From [place]   Retired from [employer]   Married March 09, 1970   1 son alive as of Mar 09, 2021- he  lives in [place] (her other son died in 03-09-2017).    Social Determinants of Health   Financial Resource Strain: Low Risk  (02/24/2023)   Overall Financial Resource Strain (CARDIA)    Difficulty of Paying Living Expenses: Not hard at all  Food Insecurity: No Food Insecurity (02/24/2023)   Hunger Vital Sign    Worried About Running Out of Food in the Last Year: Never true    Ran Out of Food in the Last Year: Never true  Transportation Needs: No Transportation Needs (02/24/2023)   PRAPARE - Administrator, Civil Service (Medical): No    Lack of Transportation (Non-Medical): No  Physical Activity: Sufficiently Active (02/24/2023)   Exercise Vital Sign    Days of Exercise per Week: 3 days    Minutes of Exercise per Session: 90 min  Stress: No Stress Concern Present (02/24/2023)   [employer] of Occupational Health - Occupational Stress Questionnaire    Feeling of Stress : Not at all  Social Connections: Socially Integrated (02/24/2023)   Social Connection and Isolation Panel [NHANES]    Frequency of Communication with Friends and Family: More than three times a week    Frequency of Social Gatherings with Friends and Family: More than three times a week    Attends Religious Services: More than 4 times per year    Active Member of [employer] or Organizations: Yes    Attends Banker Meetings: Never    Marital Status: Married    Tobacco Counseling Counseling given: Not Answered  Clinical Intake:  Pre-visit preparation completed: Yes  Pain : No/denies pain   BMI - recorded: 27.15 Nutritional Status: BMI 25 -29 Overweight Nutritional Risks: None Diabetes: No  How often do you need to have someone help you when you read instructions, pamphlets, or other written materials from your doctor or pharmacy?: 1 - Never  Interpreter Needed?: No  Comments: lives with husband Information entered by :: B.Sabrin Dunlevy,LPN   Activities of Daily Living    02/24/2023     1:16 PM  In your present state of health, do you have any difficulty performing the following activities:  Hearing? 1  Vision? 0  Difficulty concentrating or making decisions? 0  Walking or climbing stairs? 0  Dressing or bathing? 0  Doing errands, shopping? 0  Preparing Food and eating ? N  Using the Toilet? N  In the past six months, have you accidently leaked urine? N  Do you have problems with loss of bowel control? N  Managing your Medications? N  Managing your Finances? N  Housekeeping or managing your Housekeeping? N    Patient Care Team: Joaquim Nam, MD as PCP - General (Family  Medicine) Ernesto Rutherford, MD (Ophthalmology) McDiarmid, Leighton Roach, MD as Attending Physician (Family Medicine)  Indicate any recent Medical Services you may have received from other than [facility] providers in the past year (date may be approximate).     Assessment:   This is a routine wellness examination for Monette.  Hearing/Vision screen Hearing Screening - Comments:: Pt says her hearing is clogged sometimes due to lots of wax Vision Screening - Comments:: Pt say her vision is good Dr Dione Booze   Goals Addressed             This Visit's Progress    COMPLETED: Increase physical activity   On track    Starting 12/14/2017, I will continue to walk at least 45 minutes 3 days per week.      COMPLETED: Patient Stated   On track    12/22/2019, I will continue to ride my stationary bike 5 days a week for 30 minutes.        Depression Screen    02/24/2023    1:14 PM 01/28/2023    3:39 PM 01/20/2022   11:11 AM 01/17/2021    8:45 AM 12/22/2019   10:33 AM 12/14/2017   10:27 AM 12/08/2016   10:24 AM  PHQ 2/9 Scores  PHQ - 2 Score 0 0 0 0 0 1 1  PHQ- 9 Score  0 0 0 0 1 2    Fall Risk    02/24/2023    1:11 PM 01/28/2023    3:38 PM 01/20/2022   11:06 AM 01/17/2021    8:43 AM 12/22/2019   10:33 AM  Fall Risk   Falls in the past year? 0 0 0 0 0  Number falls in past yr: 0 0 0 0 0  Injury  with Fall? 0 0 0 0 0  Risk for fall due to : No Fall Risks No Fall Risks No Fall Risks No Fall Risks No Fall Risks  Follow up Falls prevention discussed;Education provided Falls evaluation completed Falls evaluation completed Falls evaluation completed Falls evaluation completed;Falls prevention discussed    MEDICARE RISK AT HOME: Medicare Risk at Home Any stairs in or around the home?: Yes If so, are there any without handrails?: Yes Home free of loose throw rugs in walkways, pet beds, electrical cords, etc?: Yes Adequate lighting in your home to reduce risk of falls?: Yes Life alert?: No Use of a cane, walker or w/c?: No Grab bars in the bathroom?: No Shower chair or bench in shower?: No Elevated toilet seat or a handicapped toilet?: Yes  TIMED UP AND GO:  Was the test performed?  No    Cognitive Function:    12/22/2019   10:35 AM 12/14/2017   10:27 AM 12/08/2016   10:24 AM  MMSE - Mini Mental State Exam  Orientation to time 5 5 5   Orientation to Place 5 5 5   Registration 3 3 3   Attention/ Calculation 5 0 0  Recall 3 3 3   Language- name 2 objects  0 0  Language- repeat 1 1 1   Language- follow 3 step command  3 3  Language- read & follow direction  0 0  Write a sentence  0 0  Copy design  0 0  Total score  20 20        02/24/2023    1:20 PM  6CIT Screen  What Year? 0 points  What month? 0 points  What time? 0 points  Count back from 20 0 points  Months in reverse 0 points  Repeat phrase 0 points  Total Score 0 points    Immunizations Immunization History  Administered Date(s) Administered   Fluad Quad(high Dose 65+) 11/10/2018, 12/07/2019, 01/20/2022   H1N1 02/21/2008   Influenza Split 12/22/2010   Influenza Whole 12/27/2007   Influenza, High Dose Seasonal PF 12/27/2020, 11/25/2022   Influenza,inj,Quad PF,6+ Mos 11/24/2013, 11/27/2014, 12/03/2015, 12/08/2016, 12/14/2017   PFIZER(Purple Top)SARS-COV-2 Vaccination 04/08/2019, 04/29/2019, 01/12/2020,  09/02/2020, 12/27/2020   Pfizer(Comirnaty)Fall Seasonal Vaccine 12 years and older 11/25/2022   Pneumococcal Conjugate-13 09/18/2008   Pneumococcal Polysaccharide-23 10/03/2010   Td 07/15/2003   Tdap 11/24/2013   Zoster, Live 10/15/2011    TDAP status: Up to date  Flu Vaccine status: Up to date  Pneumococcal vaccine status: Up to date  Covid-19 vaccine status: Completed vaccines  Qualifies for Shingles Vaccine? Yes   Zostavax completed No   Shingrix Completed?: No.    Education has been provided regarding the importance of this vaccine. Patient has been advised to call insurance company to determine out of pocket expense if they have not yet received this vaccine. Advised may also receive vaccine at local pharmacy or Health Dept. Verbalized acceptance and understanding.  Screening Tests Health Maintenance  Topic Date Due   Zoster Vaccines- Shingrix (1 of 2) 04/29/2023 (Originally 09/30/1992)   COVID-19 Vaccine (7 - 2023-24 season) 03/27/2023   DTaP/Tdap/Td (3 - Td or Tdap) 11/25/2023   Medicare Annual Wellness (AWV)  02/24/2024   Pneumonia Vaccine 26+ Years old  Completed   INFLUENZA VACCINE  Completed   DEXA SCAN  Completed   HPV VACCINES  Aged Out   Colonoscopy  Discontinued    Health Maintenance  There are no preventive care reminders to display for this patient.   Colorectal cancer screening: No longer required.   Mammogram status: No longer required due to age.  Bone Density status: Completed 03/20/2021. Results reflect: Bone density results: NORMAL. Repeat every 5 years.  Lung Cancer Screening: (Low Dose CT Chest recommended if Age 31-80 years, 20 pack-year currently smoking OR have quit w/in 15years.) does not qualify.   Lung Cancer Screening Referral: no  Additional Screening:  Hepatitis C Screening: does not qualify; Completed no  Vision Screening: Recommended annual ophthalmology exams for early detection of glaucoma and other disorders of the eye. Is the  patient up to date with their annual eye exam?  Yes  Who is the provider or what is the name of the office in which the patient attends annual eye exams? Dr Ernesto Rutherford If pt is not established with a provider, would they like to be referred to a provider to establish care? No .   Dental Screening: Recommended annual dental exams for proper oral hygiene  Diabetic Foot Exam: n/a  Community Resource Referral / Chronic Care Management: CRR required this visit?  No   CCM required this visit?  No    Plan:     I have personally reviewed and noted the following in the patient's chart:   Medical and social history Use of alcohol, tobacco or illicit drugs  Current medications and supplements including opioid prescriptions. Patient is not currently taking opioid prescriptions. Functional ability and status Nutritional status Physical activity Advanced directives List of other physicians Hospitalizations, surgeries, and ER visits in previous 12 months Vitals Screenings to include cognitive, depression, and falls Referrals and appointments  In addition, I have reviewed and discussed with patient certain preventive protocols, quality metrics, and best practice recommendations. A written personalized  care plan for preventive services as well as general preventive health recommendations were provided to patient.    Sue Lush, LPN   [id number]   After Visit Summary: (MyChart) Due to this being a telephonic visit, the after visit summary with patients personalized plan was offered to patient via MyChart   Nurse Notes: The patient states she is doing well and has no concerns or questions at this time.

## 2023-02-24 NOTE — Patient Instructions (Signed)
Tanya Espinoza , Thank you for taking time to come for your Medicare Wellness Visit. I appreciate your ongoing commitment to your health goals. Please review the following plan we discussed and let me know if I can assist you in the future.   Referrals/Orders/Follow-Ups/Clinician Recommendations: none  This is a list of the screening recommended for you and due dates:  Health Maintenance  Topic Date Due   Zoster (Shingles) Vaccine (1 of 2) 04/29/2023*   COVID-19 Vaccine (7 - 2023-24 season) 03/27/2023   DTaP/Tdap/Td vaccine (3 - Td or Tdap) 11/25/2023   Medicare Annual Wellness Visit  02/24/2024   Pneumonia Vaccine  Completed   Flu Shot  Completed   DEXA scan (bone density measurement)  Completed   HPV Vaccine  Aged Out   Colon Cancer Screening  Discontinued  *Topic was postponed. The date shown is not the original due date.    Advanced directives: (Declined) Advance directive discussed with you today. Even though you declined this today, please call our office should you change your mind, and we can give you the proper paperwork for you to fill out.  Next Medicare Annual Wellness Visit scheduled for next year: Yes 02/25/23 @ 1pm telephone

## 2023-03-26 ENCOUNTER — Ambulatory Visit: Payer: Medicare Other

## 2023-03-27 IMAGING — MG MM DIGITAL SCREENING BILAT W/ TOMO AND CAD
8 series · 9 of 24 positions shown · non-contrast
Comparison: Previous exam(s).

CLINICAL DATA: Screening.

EXAM:
DIGITAL SCREENING BILATERAL MAMMOGRAM WITH TOMOSYNTHESIS AND CAD
TECHNIQUE: Bilateral screening digital craniocaudal and mediolateral oblique
mammograms were obtained. Bilateral screening digital breast
tomosynthesis was performed. The images were evaluated with
computer-aided detection.

[R MLO synth-2D]
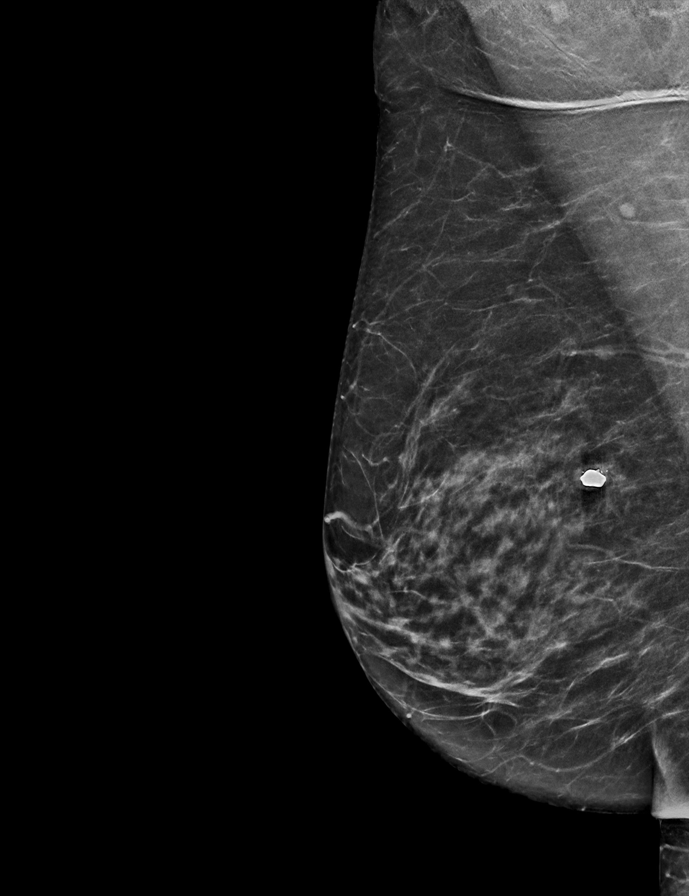

[L CC synth-2D]
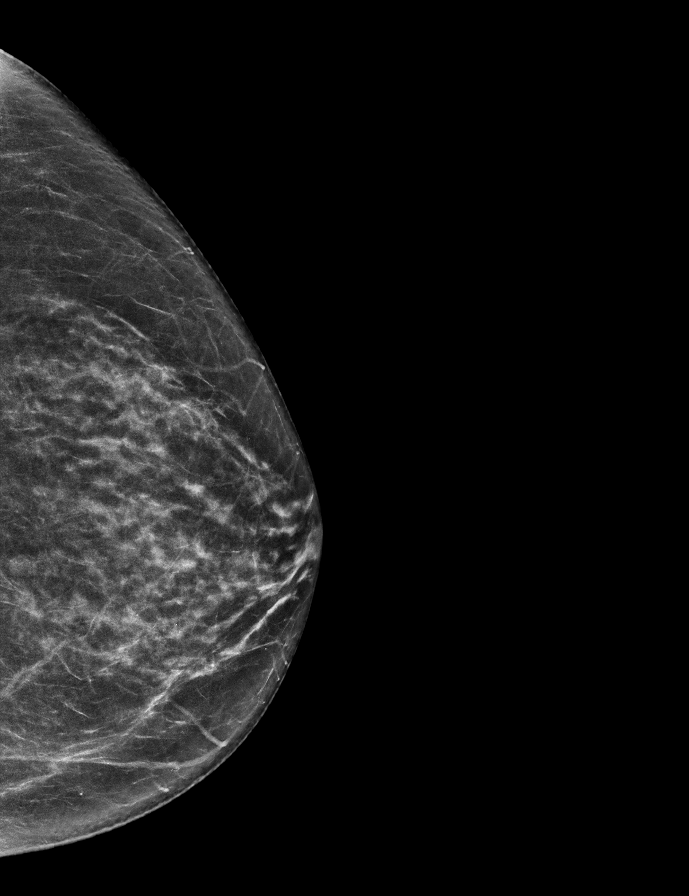

[L MLO synth-2D]
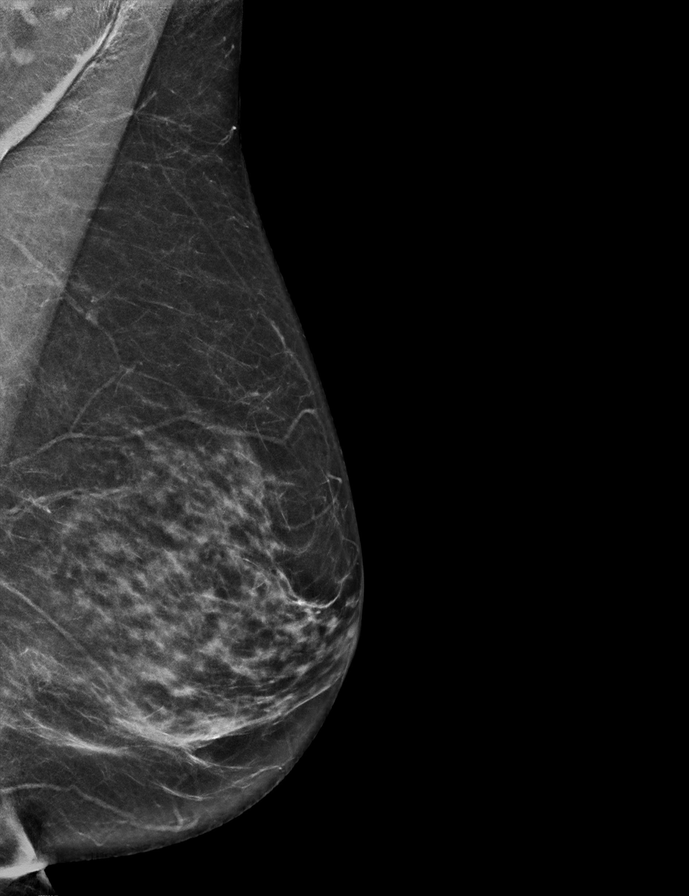

[R CC synth-2D]
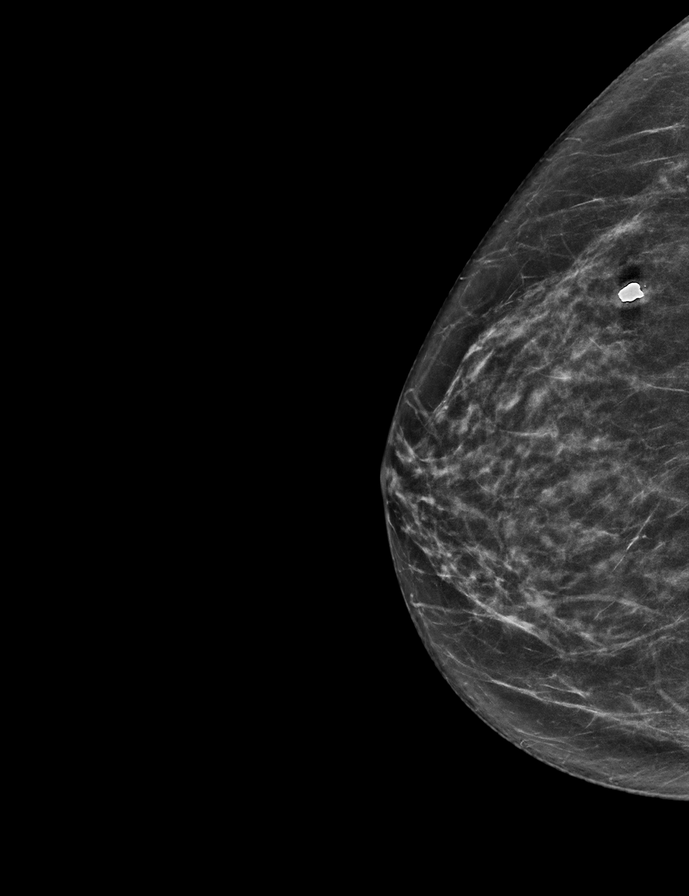

[L CC tomo · 2 of 62 frames shown]
[frame 21/62]
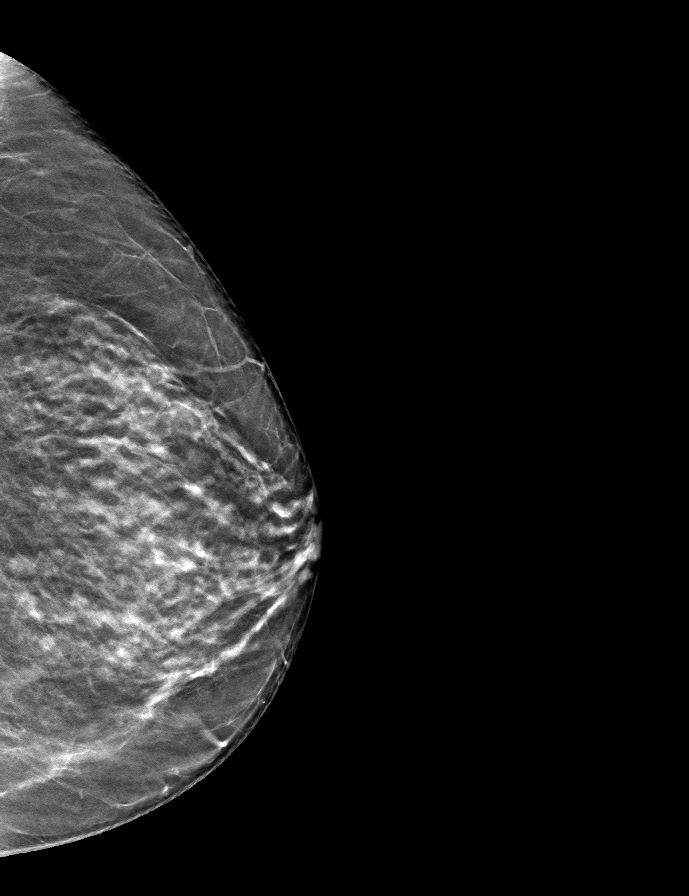
[frame 31/62]
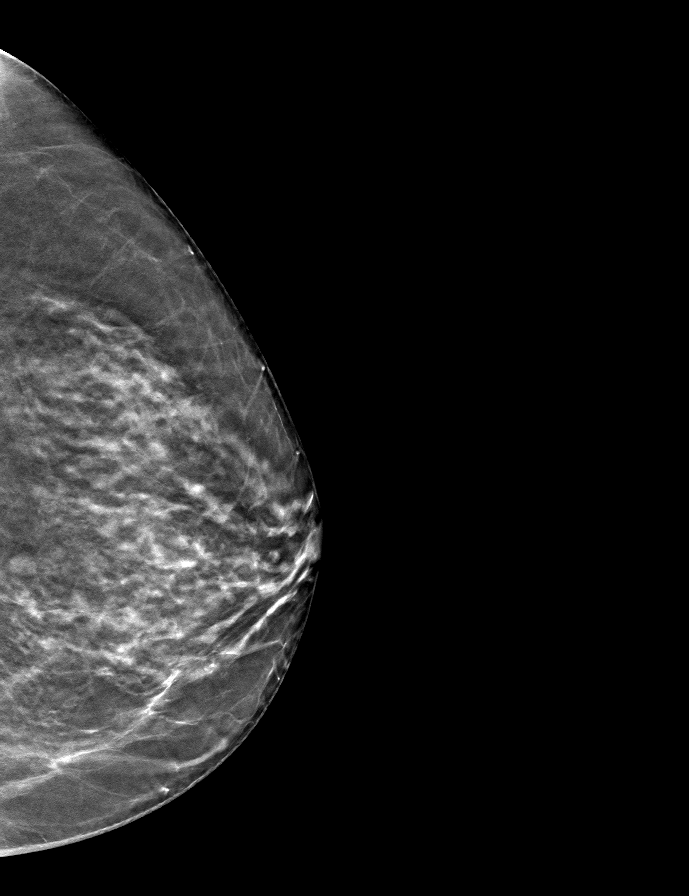

[L MLO tomo · tomo slice 33/66.0]
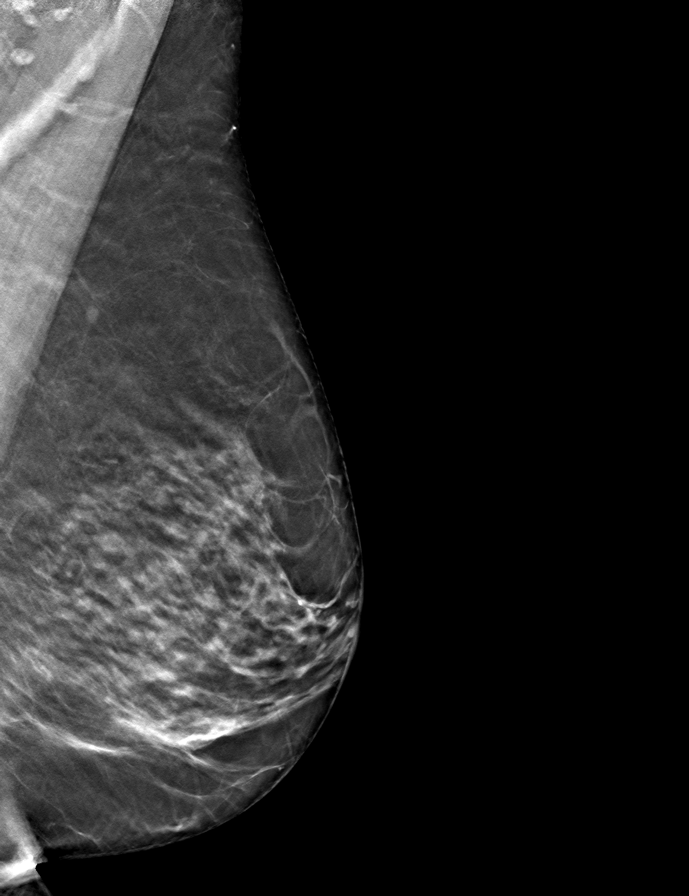

[R MLO tomo · tomo slice 32/63.0]
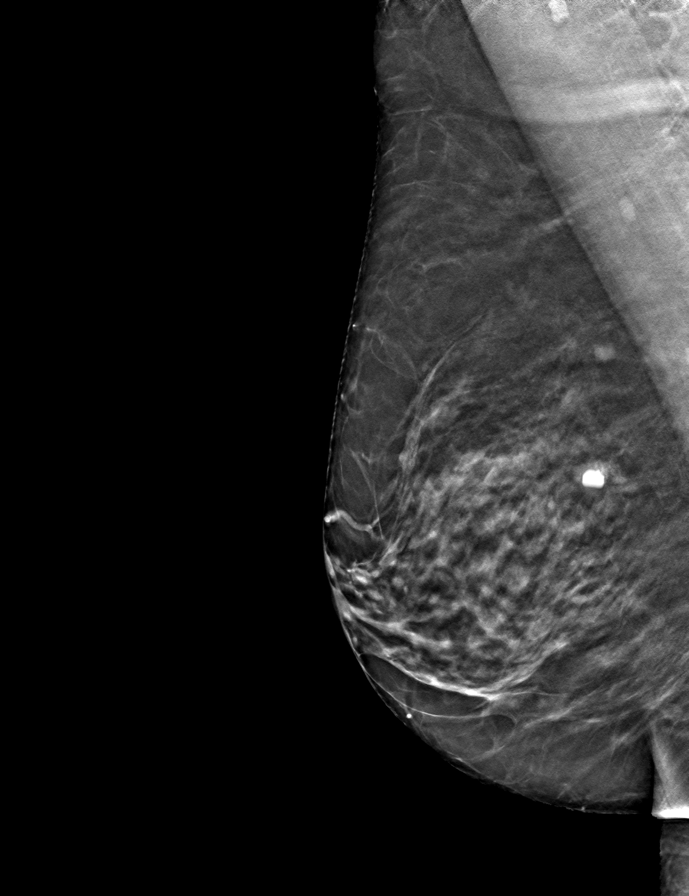

[R CC tomo · tomo slice 31/61.0]
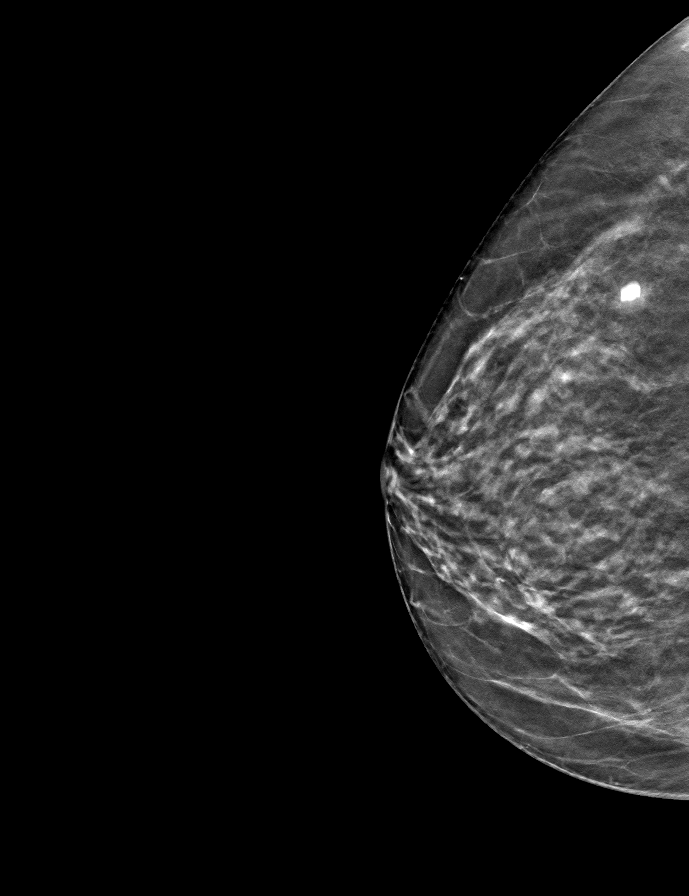

[9 of 24 positions shown; findings below may reference images not displayed]

ACR Breast Density Category c: The breast tissue is heterogeneously
dense, which may obscure small masses.
FINDINGS: There are no findings suspicious for malignancy.
IMPRESSION: No mammographic evidence of malignancy. A result letter of this
screening mammogram will be mailed directly to the patient.

RECOMMENDATION:
Screening mammogram in one year. (Code:[id number])

BI-RADS CATEGORY  1: Negative.

## 2023-04-09 ENCOUNTER — Ambulatory Visit
Admission: RE | Admit: 2023-04-09 | Discharge: 2023-04-09 | Disposition: A | Discharge status: Home / Self Care | Payer: Medicare Other | Source: Ambulatory Visit | Attending: Family Medicine | Admitting: Family Medicine

## 2023-04-09 DIAGNOSIS — Z1231 Encounter for screening mammogram for malignant neoplasm of breast: Secondary | ICD-10-CM

## 2023-11-05 ENCOUNTER — Other Ambulatory Visit: Payer: Self-pay | Admitting: Family Medicine

## 2023-11-05 DIAGNOSIS — Z1231 Encounter for screening mammogram for malignant neoplasm of breast: Secondary | ICD-10-CM

## 2023-11-08 ENCOUNTER — Telehealth: Payer: Self-pay | Admitting: Family Medicine

## 2023-11-08 NOTE — Telephone Encounter (Signed)
 Lvm to schedule CPE with Prior fasting lab  Copied from CRM #[id number]. Topic: Appointments - Scheduling Inquiry for Clinic >> Nov 05, 2023  3:52 PM Tanya Espinoza wrote: Reason for CRM: Patient is calling in to schedule her physical the DT would only pull for a office visit. Patient would also like to schedule her husbands as well close to the same time.

## 2023-12-24 ENCOUNTER — Telehealth: Payer: Self-pay

## 2023-12-24 NOTE — Telephone Encounter (Signed)
 Attempted to call pt no answer, LMTCB.    Last COVID: 11/25/22 Last Pneumonia: 10/03/10 Last Shingles: 10/15/11  Please schedule pt for a nurse visit appointment if she would like flu shot given in clinic.  All other vaccines would need to be given in pharmacy.  She does not need a prescription for COVID Vaccine, encourage her to go to one of the [facility] to have these vaccines.  Please see spouse's chart for his note.     Copied from CRM [id number]. Topic: Clinical - Medical Advice >> Dec 23, 2023  9:37 AM Tanya Espinoza wrote: Reason for CRM: Patient wants to confirm when is the last time she had her covid vaccine, flu shot, pneumonia shot and singles vaccine - also wants to schedule flu shot and needs a script for the covid vaccination. Please call her at [phone] (M) - she wants to know her spouses as well Tanya Espinoza MRN: [id number]

## 2023-12-26 ENCOUNTER — Other Ambulatory Visit: Payer: Self-pay | Admitting: Family Medicine

## 2023-12-27 NOTE — Telephone Encounter (Signed)
 Spoke with patient, states she was able to get her vaccines at the pharmacy, has requested the records be sent to us . Nothing further needed at this time.

## 2024-01-24 ENCOUNTER — Other Ambulatory Visit: Payer: Self-pay | Admitting: Family Medicine

## 2024-01-24 DIAGNOSIS — M810 Age-related osteoporosis without current pathological fracture: Secondary | ICD-10-CM

## 2024-01-24 DIAGNOSIS — E785 Hyperlipidemia, unspecified: Secondary | ICD-10-CM

## 2024-01-25 ENCOUNTER — Other Ambulatory Visit (INDEPENDENT_AMBULATORY_CARE_PROVIDER_SITE_OTHER)

## 2024-01-25 DIAGNOSIS — E785 Hyperlipidemia, unspecified: Secondary | ICD-10-CM

## 2024-01-25 DIAGNOSIS — M810 Age-related osteoporosis without current pathological fracture: Secondary | ICD-10-CM | POA: Diagnosis not present

## 2024-01-25 LAB — COMPREHENSIVE METABOLIC PANEL WITH GFR
ALT: 19 U/L (ref 0–35)
AST: 24 U/L (ref 0–37)
Albumin: 4.2 g/dL (ref 3.5–5.2)
Alkaline Phosphatase: 39 U/L (ref 39–117)
BUN: 14 mg/dL (ref 6–23)
CO2: 26 meq/L (ref 19–32)
Calcium: 9.2 mg/dL (ref 8.4–10.5)
Chloride: 108 meq/L (ref 96–112)
Creatinine, Ser: 0.73 mg/dL (ref 0.40–1.20)
GFR: 77.18 mL/min (ref >60.00)
Glucose, Bld: 99 mg/dL (ref 70–99)
Potassium: 3.6 meq/L (ref 3.5–5.1)
Sodium: 142 meq/L (ref 135–145)
Total Bilirubin: 0.4 mg/dL (ref 0.2–1.2)
Total Protein: 6.7 g/dL (ref 6.0–8.3)

## 2024-01-25 LAB — CBC WITH DIFFERENTIAL/PLATELET
Basophils Absolute: 0 K/uL (ref 0.0–0.1)
Basophils Relative: 0.6 % (ref 0.0–3.0)
Eosinophils Absolute: 0.2 K/uL (ref 0.0–0.7)
Eosinophils Relative: 2.9 % (ref 0.0–5.0)
HCT: 35.1 % — ABNORMAL LOW (ref 36.0–46.0)
Hemoglobin: 12.2 g/dL (ref 12.0–15.0)
Lymphocytes Relative: 31.3 % (ref 12.0–46.0)
Lymphs Abs: 1.8 K/uL (ref 0.7–4.0)
MCHC: 34.8 g/dL (ref 30.0–36.0)
MCV: 87.7 fl (ref 78.0–100.0)
Monocytes Absolute: 0.4 K/uL (ref 0.1–1.0)
Monocytes Relative: 6.4 % (ref 3.0–12.0)
Neutro Abs: 3.3 K/uL (ref 1.4–7.7)
Neutrophils Relative %: 58.8 % (ref 43.0–77.0)
Platelets: 190 K/uL (ref 150.0–400.0)
RBC: 4 Mil/uL (ref 3.87–5.11)
RDW: 15.1 % (ref 11.5–15.5)
WBC: 5.7 K/uL (ref 4.0–10.5)

## 2024-01-25 LAB — VITAMIN D 25 HYDROXY (VIT D DEFICIENCY, FRACTURES): VITD: 51.55 ng/mL (ref 30.00–100.00)

## 2024-01-25 LAB — LIPID PANEL
Cholesterol: 148 mg/dL (ref 0–200)
HDL: 64.6 mg/dL (ref >39.00)
LDL Cholesterol: 66 mg/dL (ref 0–99)
NonHDL: 83.18
Total CHOL/HDL Ratio: 2
Triglycerides: 88 mg/dL (ref 0.0–149.0)
VLDL: 17.6 mg/dL (ref 0.0–40.0)

## 2024-01-25 LAB — TSH: TSH: 0.9 u[IU]/mL (ref 0.35–5.50)

## 2024-01-26 ENCOUNTER — Ambulatory Visit: Payer: Self-pay | Admitting: Family Medicine

## 2024-02-01 ENCOUNTER — Telehealth: Payer: Self-pay

## 2024-02-01 ENCOUNTER — Encounter: Payer: Self-pay | Admitting: Family Medicine

## 2024-02-01 ENCOUNTER — Ambulatory Visit: Admitting: Family Medicine

## 2024-02-01 VITALS — BP 116/78 | HR 63 | Temp 97.8°F | Ht 60.0 in | Wt 137.0 lb

## 2024-02-01 DIAGNOSIS — F339 Major depressive disorder, recurrent, unspecified: Secondary | ICD-10-CM

## 2024-02-01 DIAGNOSIS — M810 Age-related osteoporosis without current pathological fracture: Secondary | ICD-10-CM

## 2024-02-01 DIAGNOSIS — E785 Hyperlipidemia, unspecified: Secondary | ICD-10-CM

## 2024-02-01 DIAGNOSIS — H6120 Impacted cerumen, unspecified ear: Secondary | ICD-10-CM | POA: Diagnosis not present

## 2024-02-01 DIAGNOSIS — Z7189 Other specified counseling: Secondary | ICD-10-CM

## 2024-02-01 DIAGNOSIS — Z0000 Encounter for general adult medical examination without abnormal findings: Secondary | ICD-10-CM

## 2024-02-01 MED ORDER — ROSUVASTATIN CALCIUM 20 MG PO TABS: 20.0000 mg | ORAL_TABLET | Freq: Every day | ORAL | 3 refills | Status: DC

## 2024-02-01 MED ORDER — FUROSEMIDE 20 MG PO TABS: 20.0000 mg | ORAL_TABLET | Freq: Every day | ORAL | 3 refills | Status: AC

## 2024-02-01 MED ORDER — ROSUVASTATIN CALCIUM 20 MG PO TABS: 20.0000 mg | ORAL_TABLET | Freq: Every day | ORAL | 3 refills | Status: AC

## 2024-02-01 MED ORDER — ESCITALOPRAM OXALATE 10 MG PO TABS: 10.0000 mg | ORAL_TABLET | Freq: Every day | ORAL | 3 refills | Status: DC

## 2024-02-01 MED ORDER — FUROSEMIDE 20 MG PO TABS: 20.0000 mg | ORAL_TABLET | Freq: Every day | ORAL | 3 refills | Status: DC

## 2024-02-01 MED ORDER — ESCITALOPRAM OXALATE 10 MG PO TABS: 10.0000 mg | ORAL_TABLET | Freq: Every day | ORAL | 3 refills | Status: AC

## 2024-02-01 NOTE — Progress Notes (Unsigned)
 Osteoporosis.  Fosamax  started 04/2021.  D/w pt about taking 2000 units vit D.  No ADE on Fosamax .  Bisphosphonate cautions d/w pt. Follow-up bone density test ordered.   Elevated Cholesterol: Using medications without problems: yes Muscle aches: no Diet compliance: yes Exercise: yes Labs d/w pt.     No CP.  Not SOB.   Still taking lasix  at baseline.     Mood d/w pt.  Still on lexparo.  She wanted to continue as is.  No SI/HI.  Mood is good.  Flu shot 2025 Tdap 2015 zosta 2013- she had shingrix done at [facility].  PNA up to date.   Covid 2024 RSV d/w pt.   Mammogram 2025 DXA 2023 Colonoscopy 2015 Pap not due.   Diet and execrise d/w pt.  Encouraged both.   Advance directive d/w pt.  Husband designated if patient were incapacitated.    She needs her ears canals rechecked.  See exam.   Meds, vitals, and allergies reviewed.    ROS: Per HPI unless specifically indicated in ROS section   GEN: nad, alert and oriented HEENT: mucous membranes moist NECK: supple w/o LA CV: rrr. PULM: ctab, no inc wob ABD: soft, +bs EXT: no edema SKIN: no acute rash Cerumen impaction partially removed R ear canal.  She has narrow ear canals.  No significant wax buildup on the left side.

## 2024-02-01 NOTE — Telephone Encounter (Signed)
 Copied from CRM [id number]. Topic: Clinical - Prescription Issue >> Feb 01, 2024  1:09 PM Pinkey ORN wrote: Reason for CRM: Incorrect Pharmacy >> Feb 01, 2024  1:10 PM Pinkey ORN wrote: Patient states her medications refills were sent over to the incorrect pharmacy, they're supposed to be filled at  [facility] [place], [place] - [address] [facility] [address] AT Portal to Registered [employer] Sites  [address] [place] [address]  Phone: [phone] Fax: [phone]   Patient is requesting that the current refills be cancelled and sent to the correct pharmacy.

## 2024-02-01 NOTE — Telephone Encounter (Signed)
 Sent new scripts as requested. Called patient and let her know. She will reach out if any questions.

## 2024-02-01 NOTE — Patient Instructions (Addendum)
 Check with the pharmacy about a tetanus shot and RSV shot.    I'll work on getting the next bone density test set up.   Take care.  Glad to see you.  Let me know if you can't get set up with [facility].

## 2024-02-02 ENCOUNTER — Telehealth: Payer: Self-pay | Admitting: Family Medicine

## 2024-02-02 NOTE — Assessment & Plan Note (Signed)
 Right cerumen impaction partially removed with irrigation.  No complication.  Tolerated well.  She consented for treatment.  Refer to ENT given the difficulty she has had with recurrent cerumen impactions and narrow ear canals.

## 2024-02-02 NOTE — Assessment & Plan Note (Signed)
 Still on lexparo.  She wanted to continue as is.  No SI/HI.  Mood is good.

## 2024-02-02 NOTE — Telephone Encounter (Signed)
 I put in the follow-up bone density order. Please help patient get scheduled for mobile bone density testing, similar to what she had done before.  Thanks.

## 2024-02-02 NOTE — Assessment & Plan Note (Signed)
Advance directive- d/w pt. Husband designated if patient were incapacitated.  

## 2024-02-02 NOTE — Assessment & Plan Note (Signed)
 Flu shot 2025 Tdap 2015 zosta 2013- she had shingrix done at [facility].  PNA up to date.   Covid 2024 RSV d/w pt.   Mammogram 2025 DXA 2023 Colonoscopy 2015 Pap not due.   Diet and execrise d/w pt.  Encouraged both.   Advance directive d/w pt.  Husband designated if patient were incapacitated.

## 2024-02-02 NOTE — Assessment & Plan Note (Signed)
 Fosamax  started 04/2021.  D/w pt about taking 2000 units vit D.  No ADE on Fosamax .  Bisphosphonate cautions d/w pt. Follow-up bone density test ordered.

## 2024-02-02 NOTE — Assessment & Plan Note (Signed)
Continue Crestor.  Continue work on diet and exercise.  Labs discussed with patient. 

## 2024-02-08 NOTE — Telephone Encounter (Signed)
 I am not aware of noble bond density can be scheduled. Do you have any information on it?

## 2024-02-09 NOTE — Telephone Encounter (Addendum)
 Lvm with [facility] at [phone] asking for a call back. Need to get help scheduling pt's bone density scan with mobile unit.

## 2024-02-09 NOTE — Telephone Encounter (Signed)
 I don't have info about the mobile scheduling.   Her last DXA is listed under [facility]amm[facility]   Is there a way to schedule or give her contact info for that?

## 2024-02-16 NOTE — Telephone Encounter (Signed)
 Noted. Thanks.

## 2024-02-16 NOTE — Telephone Encounter (Signed)
 Patient was given contact information for Dexa Scan at [facility]

## 2024-04-04 ENCOUNTER — Encounter: Payer: Self-pay | Admitting: Family Medicine

## 2024-04-07 ENCOUNTER — Telehealth: Payer: Self-pay | Admitting: Family Medicine

## 2024-04-07 NOTE — Telephone Encounter (Signed)
 The patient sent me a MyChart message asking about her upcoming appointment at your clinic.  Please contact her about the rationale for that visit, which I thought was related to possible colonoscopy at age 84.  Thank you for your help.

## 2024-04-10 ENCOUNTER — Ambulatory Visit

## 2024-04-17 ENCOUNTER — Ambulatory Visit

## 2024-04-27 ENCOUNTER — Ambulatory Visit: Admitting: Gastroenterology

## 2024-04-28 ENCOUNTER — Ambulatory Visit

## 2024-05-30 ENCOUNTER — Ambulatory Visit

## 2024-07-20 ENCOUNTER — Other Ambulatory Visit (HOSPITAL_BASED_OUTPATIENT_CLINIC_OR_DEPARTMENT_OTHER)

## 2025-01-25 ENCOUNTER — Other Ambulatory Visit

## 2025-02-01 ENCOUNTER — Encounter: Admitting: Family Medicine
